# Patient Record
Sex: Female | Born: 1985 | Hispanic: No | Marital: Married | Smoking: Never smoker
Health system: Southern US, Community
[De-identification: ages and names within clinical notes are randomized; demographics above are authoritative.]

## PROBLEM LIST (undated history)

## (undated) ENCOUNTER — Inpatient Hospital Stay (HOSPITAL_COMMUNITY): Payer: Self-pay

## (undated) HISTORY — PX: WISDOM TOOTH EXTRACTION: SHX21

---

## 2011-12-06 NOTE — L&D Delivery Note (Signed)
Delivery Note At 5:08 PM a viable female was delivered via Vaginal, Spontaneous Delivery (Presentation: Left Occiput Anterior).  APGAR: 9, 9; weight pending.   Placenta status: Intact, Spontaneous.  Cord: 3 vessels with the following complications: None.  Cord pH: NA  Anesthesia: Local  Episiotomy: None Lacerations: 2nd degree Suture Repair: 3.0 vicryl rapide Est. Blood Loss (mL): 500  Mom to postpartum.  Baby to nursery-stable. Placenta to: BS Feeding: Breast Circ: no Contraception: undecided  Dorathy Kinsman 06/12/2012, 6:23 PM

## 2012-02-20 ENCOUNTER — Ambulatory Visit (INDEPENDENT_AMBULATORY_CARE_PROVIDER_SITE_OTHER): Payer: Medicaid Other | Admitting: Physician Assistant

## 2012-02-20 ENCOUNTER — Encounter: Payer: Self-pay | Admitting: Physician Assistant

## 2012-02-20 VITALS — BP 100/67 | Temp 98.6°F | Ht 68.0 in | Wt 172.0 lb

## 2012-02-20 DIAGNOSIS — O0930 Supervision of pregnancy with insufficient antenatal care, unspecified trimester: Secondary | ICD-10-CM

## 2012-02-20 DIAGNOSIS — Z3480 Encounter for supervision of other normal pregnancy, unspecified trimester: Secondary | ICD-10-CM

## 2012-02-20 NOTE — Progress Notes (Signed)
p=86 

## 2012-02-20 NOTE — Patient Instructions (Signed)
Pregnancy - Second Trimester The second trimester of pregnancy (3 to 6 months) is a period of rapid growth for you and your baby. At the end of the sixth month, your baby is about 9 inches long and weighs 1 1/2 pounds. You will begin to feel the baby move between 18 and 20 weeks of the pregnancy. This is called quickening. Weight gain is faster. A clear fluid (colostrum) may leak out of your breasts. You may feel small contractions of the womb (uterus). This is known as false labor or Braxton-Hicks contractions. This is like a practice for labor when the baby is ready to be born. Usually, the problems with morning sickness have usually passed by the end of your first trimester. Some women develop small dark blotches (called cholasma, mask of pregnancy) on their face that usually goes away after the baby is born. Exposure to the sun makes the blotches worse. Acne may also develop in some pregnant women and pregnant women who have acne, may find that it goes away. PRENATAL EXAMS  Blood work may continue to be done during prenatal exams. These tests are done to check on your health and the probable health of your baby. Blood work is used to follow your blood levels (hemoglobin). Anemia (low hemoglobin) is common during pregnancy. Iron and vitamins are given to help prevent this. You will also be checked for diabetes between 24 and 28 weeks of the pregnancy. Some of the previous blood tests may be repeated.   The size of the uterus is measured during each visit. This is to make sure that the baby is continuing to grow properly according to the dates of the pregnancy.   Your blood pressure is checked every prenatal visit. This is to make sure you are not getting toxemia.   Your urine is checked to make sure you do not have an infection, diabetes or protein in the urine.   Your weight is checked often to make sure gains are happening at the suggested rate. This is to ensure that both you and your baby are  growing normally.   Sometimes, an ultrasound is performed to confirm the proper growth and development of the baby. This is a test which bounces harmless sound waves off the baby so your caregiver can more accurately determine due dates.  Sometimes, a specialized test is done on the amniotic fluid surrounding the baby. This test is called an amniocentesis. The amniotic fluid is obtained by sticking a needle into the belly (abdomen). This is done to check the chromosomes in instances where there is a concern about possible genetic problems with the baby. It is also sometimes done near the end of pregnancy if an early delivery is required. In this case, it is done to help make sure the baby's lungs are mature enough for the baby to live outside of the womb. CHANGES OCCURING IN THE SECOND TRIMESTER OF PREGNANCY Your body goes through many changes during pregnancy. They vary from person to person. Talk to your caregiver about changes you notice that you are concerned about.  During the second trimester, you will likely have an increase in your appetite. It is normal to have cravings for certain foods. This varies from person to person and pregnancy to pregnancy.   Your lower abdomen will begin to bulge.   You may have to urinate more often because the uterus and baby are pressing on your bladder. It is also common to get more bladder infections during pregnancy (  pain with urination). You can help this by drinking lots of fluids and emptying your bladder before and after intercourse.   You may begin to get stretch marks on your hips, abdomen, and breasts. These are normal changes in the body during pregnancy. There are no exercises or medications to take that prevent this change.   You may begin to develop swollen and bulging veins (varicose veins) in your legs. Wearing support hose, elevating your feet for 15 minutes, 3 to 4 times a day and limiting salt in your diet helps lessen the problem.    Heartburn may develop as the uterus grows and pushes up against the stomach. Antacids recommended by your caregiver helps with this problem. Also, eating smaller meals 4 to 5 times a day helps.   Constipation can be treated with a stool softener or adding bulk to your diet. Drinking lots of fluids, vegetables, fruits, and whole grains are helpful.   Exercising is also helpful. If you have been very active up until your pregnancy, most of these activities can be continued during your pregnancy. If you have been less active, it is helpful to start an exercise program such as walking.   Hemorrhoids (varicose veins in the rectum) may develop at the end of the second trimester. Warm sitz baths and hemorrhoid cream recommended by your caregiver helps hemorrhoid problems.   Backaches may develop during this time of your pregnancy. Avoid heavy lifting, wear low heal shoes and practice good posture to help with backache problems.   Some pregnant women develop tingling and numbness of their hand and fingers because of swelling and tightening of ligaments in the wrist (carpel tunnel syndrome). This goes away after the baby is born.   As your breasts enlarge, you may have to get a bigger bra. Get a comfortable, cotton, support bra. Do not get a nursing bra until the last month of the pregnancy if you will be nursing the baby.   You may get a dark line from your belly button to the pubic area called the linea nigra.   You may develop rosy cheeks because of increase blood flow to the face.   You may develop spider looking lines of the face, neck, arms and chest. These go away after the baby is born.  HOME CARE INSTRUCTIONS   It is extremely important to avoid all smoking, herbs, alcohol, and unprescribed drugs during your pregnancy. These chemicals affect the formation and growth of the baby. Avoid these chemicals throughout the pregnancy to ensure the delivery of a healthy infant.   Most of your home  care instructions are the same as suggested for the first trimester of your pregnancy. Keep your caregiver's appointments. Follow your caregiver's instructions regarding medication use, exercise and diet.   During pregnancy, you are providing food for you and your baby. Continue to eat regular, well-balanced meals. Choose foods such as meat, fish, milk and other low fat dairy products, vegetables, fruits, and whole-grain breads and cereals. Your caregiver will tell you of the ideal weight gain.   A physical sexual relationship may be continued up until near the end of pregnancy if there are no other problems. Problems could include early (premature) leaking of amniotic fluid from the membranes, vaginal bleeding, abdominal pain, or other medical or pregnancy problems.   Exercise regularly if there are no restrictions. Check with your caregiver if you are unsure of the safety of some of your exercises. The greatest weight gain will occur in the   last 2 trimesters of pregnancy. Exercise will help you:   Control your weight.   Get you in shape for labor and delivery.   Lose weight after you have the baby.   Wear a good support or jogging bra for breast tenderness during pregnancy. This may help if worn during sleep. Pads or tissues may be used in the bra if you are leaking colostrum.   Do not use hot tubs, steam rooms or saunas throughout the pregnancy.   Wear your seat belt at all times when driving. This protects you and your baby if you are in an accident.   Avoid raw meat, uncooked cheese, cat litter boxes and soil used by cats. These carry germs that can cause birth defects in the baby.   The second trimester is also a good time to visit your dentist for your dental health if this has not been done yet. Getting your teeth cleaned is OK. Use a soft toothbrush. Brush gently during pregnancy.   It is easier to loose urine during pregnancy. Tightening up and strengthening the pelvic muscles will  help with this problem. Practice stopping your urination while you are going to the bathroom. These are the same muscles you need to strengthen. It is also the muscles you would use as if you were trying to stop from passing gas. You can practice tightening these muscles up 10 times a set and repeating this about 3 times per day. Once you know what muscles to tighten up, do not perform these exercises during urination. It is more likely to contribute to an infection by backing up the urine.   Ask for help if you have financial, counseling or nutritional needs during pregnancy. Your caregiver will be able to offer counseling for these needs as well as refer you for other special needs.   Your skin may become oily. If so, wash your face with mild soap, use non-greasy moisturizer and oil or cream based makeup.  MEDICATIONS AND DRUG USE IN PREGNANCY  Take prenatal vitamins as directed. The vitamin should contain 1 milligram of folic acid. Keep all vitamins out of reach of children. Only a couple vitamins or tablets containing iron may be fatal to a baby or young child when ingested.   Avoid use of all medications, including herbs, over-the-counter medications, not prescribed or suggested by your caregiver. Only take over-the-counter or prescription medicines for pain, discomfort, or fever as directed by your caregiver. Do not use aspirin.   Let your caregiver also know about herbs you may be using.   Alcohol is related to a number of birth defects. This includes fetal alcohol syndrome. All alcohol, in any form, should be avoided completely. Smoking will cause low birth rate and premature babies.   Street or illegal drugs are very harmful to the baby. They are absolutely forbidden. A baby born to an addicted mother will be addicted at birth. The baby will go through the same withdrawal an adult does.  SEEK MEDICAL CARE IF:  You have any concerns or worries during your pregnancy. It is better to call with  your questions if you feel they cannot wait, rather than worry about them. SEEK IMMEDIATE MEDICAL CARE IF:   An unexplained oral temperature above 102 F (38.9 C) develops, or as your caregiver suggests.   You have leaking of fluid from the vagina (birth canal). If leaking membranes are suspected, take your temperature and tell your caregiver of this when you call.   There   is vaginal spotting, bleeding, or passing clots. Tell your caregiver of the amount and how many pads are used. Light spotting in pregnancy is common, especially following intercourse.   You develop a bad smelling vaginal discharge with a change in the color from clear to white.   You continue to feel sick to your stomach (nauseated) and have no relief from remedies suggested. You vomit blood or coffee ground-like materials.   You lose more than 2 pounds of weight or gain more than 2 pounds of weight over 1 week, or as suggested by your caregiver.   You notice swelling of your face, hands, feet, or legs.   You get exposed to German measles and have never had them.   You are exposed to fifth disease or chickenpox.   You develop belly (abdominal) pain. Round ligament discomfort is a common non-cancerous (benign) cause of abdominal pain in pregnancy. Your caregiver still must evaluate you.   You develop a bad headache that does not go away.   You develop fever, diarrhea, pain with urination, or shortness of breath.   You develop visual problems, blurry, or double vision.   You fall or are in a car accident or any kind of trauma.   There is mental or physical violence at home.  Document Released: 11/15/2001 Document Revised: 11/10/2011 Document Reviewed: 05/20/2009 Exit[employer] Patient Information 2012 [employer]. 

## 2012-02-20 NOTE — Progress Notes (Signed)
New OB, late to care. Desires midwifery care.  Subjective:    Christy Koch is a G1P0 [redacted]w[redacted]d being seen today for her first obstetrical visit.  Her obstetrical history is significant for late to care. Patient does intend to breast feed. Pregnancy history fully reviewed.  Patient reports no bleeding, no contractions, no cramping, no leaking and some nausea.  Filed Vitals:   02/20/12 0850 02/20/12 0906  BP: 100/67   Temp: 98.6 F (37 C)   Height:  5\' 8"  (1.727 m)  Weight: 172 lb (78.019 kg)     HISTORY: OB History    Grav Para Term Preterm Abortions TAB SAB Ect Mult Living   1              # Outc Date GA Lbr Len/2nd Wgt Sex Del Anes PTL Lv   1 CUR              No past medical history on file. No past surgical history on file. No family history on file.   Exam    Uterine Size: 24 cm  Pelvic Exam: Deferred  System: Breast:  Inspection negative, left breast leaking   Skin: normal coloration and turgor, no rashes    Neurologic: oriented, normal, gait normal; reflexes normal and symmetric   Extremities: normal strength, tone, and muscle mass   HEENT grossly NL   Mouth/Teeth mucous membranes moist, pharynx normal without lesions   Neck supple and no masses   Cardiovascular: regular rate and rhythm, no murmurs or gallops   Respiratory:  appears well, vitals normal, no respiratory distress, acyanotic, normal RR   Abdomen: gravid, non tender size=dates   Urinary: not examined      Assessment:    Pregnancy: G1P0 Patient Active Problem List  Diagnoses  . Insufficient prenatal care        Plan:     Initial labs drawn. Prenatal vitamins. Problem list reviewed and updated. Anticipatory Guidance Genetic Screening discussed: Too late Ultrasound discussed; fetal survey: ordered. Follow up in 4 weeks.   Jakirah Zaun E. 02/20/2012

## 2012-02-21 LAB — OBSTETRIC PANEL
Basophils Absolute: 0 10*3/uL (ref 0.0–0.1)
Basophils Relative: 0 % (ref 0–1)
Eosinophils Absolute: 0.1 10*3/uL (ref 0.0–0.7)
Eosinophils Relative: 1 % (ref 0–5)
Eosinophils Relative: 1 % (ref 0–5)
Hemoglobin: 11.3 g/dL — ABNORMAL LOW (ref 12.0–15.0)
Hepatitis B Surface Ag: NEGATIVE
Lymphocytes Relative: 15 % (ref 12–46)
Lymphs Abs: 1.7 10*3/uL (ref 0.7–4.0)
Lymphs Abs: 1.8 10*3/uL (ref 0.7–4.0)
MCH: 28 pg (ref 26.0–34.0)
MCHC: 30.8 g/dL (ref 30.0–36.0)
MCV: 91.1 fL (ref 78.0–100.0)
MCV: 91.1 fL (ref 78.0–100.0)
Platelets: 228 10*3/uL (ref 150–400)
Platelets: 237 10*3/uL (ref 150–400)
RBC: 4.04 MIL/uL (ref 3.87–5.11)
RDW: 14.6 % (ref 11.5–15.5)
Rubella: 136.1 IU/mL — ABNORMAL HIGH
WBC: 11.6 10*3/uL — ABNORMAL HIGH (ref 4.0–10.5)

## 2012-02-21 LAB — HIV ANTIBODY (ROUTINE TESTING W REFLEX): HIV: NONREACTIVE

## 2012-02-21 LAB — GC/CHLAMYDIA PROBE AMP, URINE: GC Probe Amp, Urine: NEGATIVE

## 2012-02-22 LAB — CULTURE, URINE COMPREHENSIVE
Colony Count: NO GROWTH
Organism ID, Bacteria: NO GROWTH

## 2012-03-19 ENCOUNTER — Ambulatory Visit (INDEPENDENT_AMBULATORY_CARE_PROVIDER_SITE_OTHER): Payer: Medicaid Other | Admitting: Advanced Practice Midwife

## 2012-03-19 ENCOUNTER — Ambulatory Visit (HOSPITAL_COMMUNITY)
Admission: RE | Admit: 2012-03-19 | Discharge: 2012-03-19 | Disposition: A | Discharge status: Home / Self Care | Payer: Medicaid Other | Source: Ambulatory Visit | Attending: Physician Assistant | Admitting: Physician Assistant

## 2012-03-19 ENCOUNTER — Encounter: Payer: Self-pay | Admitting: Advanced Practice Midwife

## 2012-03-19 ENCOUNTER — Other Ambulatory Visit: Payer: Self-pay | Admitting: Physician Assistant

## 2012-03-19 VITALS — BP 98/66 | Temp 98.0°F | Wt 172.0 lb

## 2012-03-19 DIAGNOSIS — O358XX0 Maternal care for other (suspected) fetal abnormality and damage, not applicable or unspecified: Secondary | ICD-10-CM | POA: Insufficient documentation

## 2012-03-19 DIAGNOSIS — Z1389 Encounter for screening for other disorder: Secondary | ICD-10-CM | POA: Insufficient documentation

## 2012-03-19 DIAGNOSIS — O0930 Supervision of pregnancy with insufficient antenatal care, unspecified trimester: Secondary | ICD-10-CM

## 2012-03-19 DIAGNOSIS — Z363 Encounter for antenatal screening for malformations: Secondary | ICD-10-CM | POA: Insufficient documentation

## 2012-03-19 NOTE — Progress Notes (Signed)
Discussed midwifery care and some discussion of waterbirth.  Lives in [place] and commutes. Does not want to do glucola today. Wants to do jellybean version.

## 2012-03-19 NOTE — Patient Instructions (Signed)
Pregnancy - Second Trimester The second trimester of pregnancy (3 to 6 months) is a period of rapid growth for you and your baby. At the end of the sixth month, your baby is about 9 inches long and weighs 1 1/2 pounds. You will begin to feel the baby move between 18 and 20 weeks of the pregnancy. This is called quickening. Weight gain is faster. A clear fluid (colostrum) may leak out of your breasts. You may feel small contractions of the womb (uterus). This is known as false labor or Braxton-Hicks contractions. This is like a practice for labor when the baby is ready to be born. Usually, the problems with morning sickness have usually passed by the end of your first trimester. Some women develop small dark blotches (called cholasma, mask of pregnancy) on their face that usually goes away after the baby is born. Exposure to the sun makes the blotches worse. Acne may also develop in some pregnant women and pregnant women who have acne, may find that it goes away. PRENATAL EXAMS  Blood work may continue to be done during prenatal exams. These tests are done to check on your health and the probable health of your baby. Blood work is used to follow your blood levels (hemoglobin). Anemia (low hemoglobin) is common during pregnancy. Iron and vitamins are given to help prevent this. You will also be checked for diabetes between 24 and 28 weeks of the pregnancy. Some of the previous blood tests may be repeated.   The size of the uterus is measured during each visit. This is to make sure that the baby is continuing to grow properly according to the dates of the pregnancy.   Your blood pressure is checked every prenatal visit. This is to make sure you are not getting toxemia.   Your urine is checked to make sure you do not have an infection, diabetes or protein in the urine.   Your weight is checked often to make sure gains are happening at the suggested rate. This is to ensure that both you and your baby are  growing normally.   Sometimes, an ultrasound is performed to confirm the proper growth and development of the baby. This is a test which bounces harmless sound waves off the baby so your caregiver can more accurately determine due dates.  Sometimes, a specialized test is done on the amniotic fluid surrounding the baby. This test is called an amniocentesis. The amniotic fluid is obtained by sticking a needle into the belly (abdomen). This is done to check the chromosomes in instances where there is a concern about possible genetic problems with the baby. It is also sometimes done near the end of pregnancy if an early delivery is required. In this case, it is done to help make sure the baby's lungs are mature enough for the baby to live outside of the womb. CHANGES OCCURING IN THE SECOND TRIMESTER OF PREGNANCY Your body goes through many changes during pregnancy. They vary from person to person. Talk to your caregiver about changes you notice that you are concerned about.  During the second trimester, you will likely have an increase in your appetite. It is normal to have cravings for certain foods. This varies from person to person and pregnancy to pregnancy.   Your lower abdomen will begin to bulge.   You may have to urinate more often because the uterus and baby are pressing on your bladder. It is also common to get more bladder infections during pregnancy (  pain with urination). You can help this by drinking lots of fluids and emptying your bladder before and after intercourse.   You may begin to get stretch marks on your hips, abdomen, and breasts. These are normal changes in the body during pregnancy. There are no exercises or medications to take that prevent this change.   You may begin to develop swollen and bulging veins (varicose veins) in your legs. Wearing support hose, elevating your feet for 15 minutes, 3 to 4 times a day and limiting salt in your diet helps lessen the problem.    Heartburn may develop as the uterus grows and pushes up against the stomach. Antacids recommended by your caregiver helps with this problem. Also, eating smaller meals 4 to 5 times a day helps.   Constipation can be treated with a stool softener or adding bulk to your diet. Drinking lots of fluids, vegetables, fruits, and whole grains are helpful.   Exercising is also helpful. If you have been very active up until your pregnancy, most of these activities can be continued during your pregnancy. If you have been less active, it is helpful to start an exercise program such as walking.   Hemorrhoids (varicose veins in the rectum) may develop at the end of the second trimester. Warm sitz baths and hemorrhoid cream recommended by your caregiver helps hemorrhoid problems.   Backaches may develop during this time of your pregnancy. Avoid heavy lifting, wear low heal shoes and practice good posture to help with backache problems.   Some pregnant women develop tingling and numbness of their hand and fingers because of swelling and tightening of ligaments in the wrist (carpel tunnel syndrome). This goes away after the baby is born.   As your breasts enlarge, you may have to get a bigger bra. Get a comfortable, cotton, support bra. Do not get a nursing bra until the last month of the pregnancy if you will be nursing the baby.   You may get a dark line from your belly button to the pubic area called the linea nigra.   You may develop rosy cheeks because of increase blood flow to the face.   You may develop spider looking lines of the face, neck, arms and chest. These go away after the baby is born.  HOME CARE INSTRUCTIONS   It is extremely important to avoid all smoking, herbs, alcohol, and unprescribed drugs during your pregnancy. These chemicals affect the formation and growth of the baby. Avoid these chemicals throughout the pregnancy to ensure the delivery of a healthy infant.   Most of your home  care instructions are the same as suggested for the first trimester of your pregnancy. Keep your caregiver's appointments. Follow your caregiver's instructions regarding medication use, exercise and diet.   During pregnancy, you are providing food for you and your baby. Continue to eat regular, well-balanced meals. Choose foods such as meat, fish, milk and other low fat dairy products, vegetables, fruits, and whole-grain breads and cereals. Your caregiver will tell you of the ideal weight gain.   A physical sexual relationship may be continued up until near the end of pregnancy if there are no other problems. Problems could include early (premature) leaking of amniotic fluid from the membranes, vaginal bleeding, abdominal pain, or other medical or pregnancy problems.   Exercise regularly if there are no restrictions. Check with your caregiver if you are unsure of the safety of some of your exercises. The greatest weight gain will occur in the   last 2 trimesters of pregnancy. Exercise will help you:   Control your weight.   Get you in shape for labor and delivery.   Lose weight after you have the baby.   Wear a good support or jogging bra for breast tenderness during pregnancy. This may help if worn during sleep. Pads or tissues may be used in the bra if you are leaking colostrum.   Do not use hot tubs, steam rooms or saunas throughout the pregnancy.   Wear your seat belt at all times when driving. This protects you and your baby if you are in an accident.   Avoid raw meat, uncooked cheese, cat litter boxes and soil used by cats. These carry germs that can cause birth defects in the baby.   The second trimester is also a good time to visit your dentist for your dental health if this has not been done yet. Getting your teeth cleaned is OK. Use a soft toothbrush. Brush gently during pregnancy.   It is easier to loose urine during pregnancy. Tightening up and strengthening the pelvic muscles will  help with this problem. Practice stopping your urination while you are going to the bathroom. These are the same muscles you need to strengthen. It is also the muscles you would use as if you were trying to stop from passing gas. You can practice tightening these muscles up 10 times a set and repeating this about 3 times per day. Once you know what muscles to tighten up, do not perform these exercises during urination. It is more likely to contribute to an infection by backing up the urine.   Ask for help if you have financial, counseling or nutritional needs during pregnancy. Your caregiver will be able to offer counseling for these needs as well as refer you for other special needs.   Your skin may become oily. If so, wash your face with mild soap, use non-greasy moisturizer and oil or cream based makeup.  MEDICATIONS AND DRUG USE IN PREGNANCY  Take prenatal vitamins as directed. The vitamin should contain 1 milligram of folic acid. Keep all vitamins out of reach of children. Only a couple vitamins or tablets containing iron may be fatal to a baby or young child when ingested.   Avoid use of all medications, including herbs, over-the-counter medications, not prescribed or suggested by your caregiver. Only take over-the-counter or prescription medicines for pain, discomfort, or fever as directed by your caregiver. Do not use aspirin.   Let your caregiver also know about herbs you may be using.   Alcohol is related to a number of birth defects. This includes fetal alcohol syndrome. All alcohol, in any form, should be avoided completely. Smoking will cause low birth rate and premature babies.   Street or illegal drugs are very harmful to the baby. They are absolutely forbidden. A baby born to an addicted mother will be addicted at birth. The baby will go through the same withdrawal an adult does.  SEEK MEDICAL CARE IF:  You have any concerns or worries during your pregnancy. It is better to call with  your questions if you feel they cannot wait, rather than worry about them. SEEK IMMEDIATE MEDICAL CARE IF:   An unexplained oral temperature above 102 F (38.9 C) develops, or as your caregiver suggests.   You have leaking of fluid from the vagina (birth canal). If leaking membranes are suspected, take your temperature and tell your caregiver of this when you call.   There   is vaginal spotting, bleeding, or passing clots. Tell your caregiver of the amount and how many pads are used. Light spotting in pregnancy is common, especially following intercourse.   You develop a bad smelling vaginal discharge with a change in the color from clear to white.   You continue to feel sick to your stomach (nauseated) and have no relief from remedies suggested. You vomit blood or coffee ground-like materials.   You lose more than 2 pounds of weight or gain more than 2 pounds of weight over 1 week, or as suggested by your caregiver.   You notice swelling of your face, hands, feet, or legs.   You get exposed to German measles and have never had them.   You are exposed to fifth disease or chickenpox.   You develop belly (abdominal) pain. Round ligament discomfort is a common non-cancerous (benign) cause of abdominal pain in pregnancy. Your caregiver still must evaluate you.   You develop a bad headache that does not go away.   You develop fever, diarrhea, pain with urination, or shortness of breath.   You develop visual problems, blurry, or double vision.   You fall or are in a car accident or any kind of trauma.   There is mental or physical violence at home.  Document Released: 11/15/2001 Document Revised: 11/10/2011 Document Reviewed: 05/20/2009 Exit[employer] Patient Information 2012 [employer]. 

## 2012-03-19 NOTE — Progress Notes (Signed)
p-90  Pt needs written orders for 28 wk labs to be drawn closer to home

## 2012-04-27 ENCOUNTER — Encounter: Payer: Self-pay | Admitting: Advanced Practice Midwife

## 2012-04-27 ENCOUNTER — Ambulatory Visit (INDEPENDENT_AMBULATORY_CARE_PROVIDER_SITE_OTHER): Payer: Medicaid Other | Admitting: Advanced Practice Midwife

## 2012-04-27 VITALS — BP 106/73 | Temp 98.5°F | Wt 168.0 lb

## 2012-04-27 DIAGNOSIS — Z331 Pregnant state, incidental: Secondary | ICD-10-CM

## 2012-04-27 DIAGNOSIS — O26842 Uterine size-date discrepancy, second trimester: Secondary | ICD-10-CM

## 2012-04-27 DIAGNOSIS — O26849 Uterine size-date discrepancy, unspecified trimester: Secondary | ICD-10-CM

## 2012-04-27 DIAGNOSIS — Z3490 Encounter for supervision of normal pregnancy, unspecified, unspecified trimester: Secondary | ICD-10-CM | POA: Insufficient documentation

## 2012-04-27 NOTE — Progress Notes (Signed)
Did not do GTT due to illness. Strongly encouraged to do so ASAP. Will do in [place]. Many questions RE: hospital routines, policies, waterbirth. Desire low-intervention. Considering water labor or birth. Rec class. Discussed CBE options. May declined e-mycin for baby. Rec GC/CT testing at 36 weeks for documentation. S>D on anatomy US 03/19/12. 12 days ahead. Had [place] at ~14 weeks. Unsure of assigned EDD. Will request records to determine best EDD. Will use 06/05/12 EDD otherwise. Consider F/U growth [place] at 36 weeks.

## 2012-04-27 NOTE — Progress Notes (Signed)
p-85  Does not want to know sex of baby

## 2012-05-11 ENCOUNTER — Encounter: Payer: Self-pay | Admitting: Family

## 2012-05-11 ENCOUNTER — Ambulatory Visit (INDEPENDENT_AMBULATORY_CARE_PROVIDER_SITE_OTHER): Payer: Medicaid Other | Admitting: Family

## 2012-05-11 VITALS — BP 113/73 | Temp 98.5°F | Wt 171.0 lb

## 2012-05-11 DIAGNOSIS — Z3490 Encounter for supervision of normal pregnancy, unspecified, unspecified trimester: Secondary | ICD-10-CM

## 2012-05-11 DIAGNOSIS — Z3480 Encounter for supervision of other normal pregnancy, unspecified trimester: Secondary | ICD-10-CM

## 2012-05-11 NOTE — Progress Notes (Signed)
p-85  Pt has never done her 1 hr GTT, wanted to do the jellybean version but still hasn't done it.

## 2012-05-11 NOTE — Progress Notes (Signed)
Reports feeling pressure on cervix; no contractions, vaginal bleeding or leaking of fluid; has not completed 1 hr yet, obtain random CBG today; if not completed by next visit will do a fasting CBG; preterm labor precautions.  EDD change received copy of ultrasound completed on 12/16/11 showing a EDD of 06/10/13, measuring 14.5; pt states certain LMP (-7day diff>defer to LMP date).

## 2012-05-12 LAB — GLUCOSE, RANDOM: Glucose, Bld: 75 mg/dL (ref 70–99)

## 2012-06-01 ENCOUNTER — Ambulatory Visit (INDEPENDENT_AMBULATORY_CARE_PROVIDER_SITE_OTHER): Payer: Medicaid Other | Admitting: Advanced Practice Midwife

## 2012-06-01 ENCOUNTER — Other Ambulatory Visit: Payer: Self-pay | Admitting: Advanced Practice Midwife

## 2012-06-01 VITALS — BP 111/76 | Temp 98.4°F | Wt 170.0 lb

## 2012-06-01 DIAGNOSIS — Z3400 Encounter for supervision of normal first pregnancy, unspecified trimester: Secondary | ICD-10-CM

## 2012-06-01 LAB — POCT CBG (FASTING - GLUCOSE)-MANUAL ENTRY: Glucose Fasting, POC: 86 mg/dL (ref 70–99)

## 2012-06-01 NOTE — Progress Notes (Signed)
p-79  CBG today fasting is 86

## 2012-06-01 NOTE — Patient Instructions (Signed)
Normal Labor and Delivery Your caregiver must first be sure you are in labor. Signs of labor include:  You may pass what is called "the mucus plug" before labor begins. This is a small amount of blood stained mucus.   Regular uterine contractions.   The time between contractions get closer together.   The discomfort and pain gradually gets more intense.   Pains are mostly located in the back.   Pains get worse when walking.   The cervix (the opening of the uterus becomes thinner (begins to efface) and opens up (dilates).  Once you are in labor and admitted into the hospital or care center, your caregiver will do the following:  A complete physical examination.   Check your vital signs (blood pressure, pulse, temperature and the fetal heart rate).   Do a vaginal examination (using a sterile glove and lubricant) to determine:   The position (presentation) of the baby (head [vertex] or buttock first).   The level (station) of the baby's head in the birth canal.   The effacement and dilatation of the cervix.   You may have your pubic hair shaved and be given an enema depending on your caregiver and the circumstance.   An electronic monitor is usually placed on your abdomen. The monitor follows the length and intensity of the contractions, as well as the baby's heart rate.   Usually, your caregiver will insert an IV in your arm with a bottle of sugar water. This is done as a precaution so that medications can be given to you quickly during labor or delivery.  NORMAL LABOR AND DELIVERY IS DIVIDED UP INTO 3 STAGES: First Stage This is when regular contractions begin and the cervix begins to efface and dilate. This stage can last from 3 to 15 hours. The end of the first stage is when the cervix is 100% effaced and 10 centimeters dilated. Pain medications may be given by   Injection (morphine, demerol, etc.)   Regional anesthesia (spinal, caudal or epidural, anesthetics given in  different locations of the spine). Paracervical pain medication may be given, which is an injection of and anesthetic on each side of the cervix.  A pregnant woman may request to have "Natural Childbirth" which is not to have any medications or anesthesia during her labor and delivery. Second Stage This is when the baby comes down through the birth canal (vagina) and is born. This can take 1 to 4 hours. As the baby's head comes down through the birth canal, you may feel like you are going to have a bowel movement. You will get the urge to bear down and push until the baby is delivered. As the baby's head is being delivered, the caregiver will decide if an episiotomy (a cut in the perineum and vagina area) is needed to prevent tearing of the tissue in this area. The episiotomy is sewn up after the delivery of the baby and placenta. Sometimes a mask with nitrous oxide is given for the mother to breath during the delivery of the baby to help if there is too much pain. The end of Stage 2 is when the baby is fully delivered. Then when the umbilical cord stops pulsating it is clamped and cut. Third Stage The third stage begins after the baby is completely delivered and ends after the placenta (afterbirth) is delivered. This usually takes 5 to 30 minutes. After the placenta is delivered, a medication is given either by intravenous or injection to help contract   the uterus and prevent bleeding. The third stage is not painful and pain medication is usually not necessary. If an episiotomy was done, it is repaired at this time. After the delivery, the mother is watched and monitored closely for 1 to 2 hours to make sure there is no postpartum bleeding (hemorrhage). If there is a lot of bleeding, medication is given to contract the uterus and stop the bleeding. Document Released: 08/30/2008 Document Revised: 11/10/2011 Document Reviewed: 08/30/2008 [employer] Patient Information 2012 [employer]. 

## 2012-06-02 LAB — GC/CHLAMYDIA PROBE AMP, GENITAL
Chlamydia, DNA Probe: NEGATIVE
GC Probe Amp, Genital: NEGATIVE

## 2012-06-05 ENCOUNTER — Encounter: Payer: Self-pay | Admitting: Advanced Practice Midwife

## 2012-06-05 LAB — CULTURE, BETA STREP (GROUP B ONLY)

## 2012-06-05 NOTE — Progress Notes (Signed)
Reviewed normal FBS. Still concerned about correct EDD. Staying in friend's house in [place] for 3 weeks. Reviewed s/s labor.

## 2012-06-11 ENCOUNTER — Ambulatory Visit (INDEPENDENT_AMBULATORY_CARE_PROVIDER_SITE_OTHER): Payer: Medicaid Other | Admitting: Family

## 2012-06-11 VITALS — BP 101/73 | Wt 170.0 lb

## 2012-06-11 DIAGNOSIS — Z3490 Encounter for supervision of normal pregnancy, unspecified, unspecified trimester: Secondary | ICD-10-CM

## 2012-06-11 DIAGNOSIS — Z3480 Encounter for supervision of other normal pregnancy, unspecified trimester: Secondary | ICD-10-CM

## 2012-06-11 NOTE — Progress Notes (Signed)
Random BS 86; irregular contractions; reviewed birth plan and received copy; reviewed labor precautions. Desires info on cord blood banking.

## 2012-06-11 NOTE — Progress Notes (Signed)
Routine prenatal check, doing well, increased intensity in contractions and pelvic pressure.

## 2012-06-12 ENCOUNTER — Encounter (HOSPITAL_COMMUNITY): Payer: Self-pay | Admitting: Anesthesiology

## 2012-06-12 ENCOUNTER — Encounter (HOSPITAL_COMMUNITY): Payer: Self-pay | Admitting: *Deleted

## 2012-06-12 ENCOUNTER — Inpatient Hospital Stay (HOSPITAL_COMMUNITY)
Admission: AD | Admit: 2012-06-12 | Discharge: 2012-06-13 | DRG: 775 | Disposition: A | Discharge status: Home / Self Care | Payer: Medicaid Other | Source: Ambulatory Visit | Attending: Obstetrics & Gynecology | Admitting: Obstetrics & Gynecology

## 2012-06-12 DIAGNOSIS — O26849 Uterine size-date discrepancy, unspecified trimester: Secondary | ICD-10-CM

## 2012-06-12 DIAGNOSIS — Z3490 Encounter for supervision of normal pregnancy, unspecified, unspecified trimester: Secondary | ICD-10-CM

## 2012-06-12 DIAGNOSIS — O0930 Supervision of pregnancy with insufficient antenatal care, unspecified trimester: Secondary | ICD-10-CM

## 2012-06-12 LAB — CBC
HCT: 34.8 % — ABNORMAL LOW (ref 36.0–46.0)
Hemoglobin: 11.2 g/dL — ABNORMAL LOW (ref 12.0–15.0)
MCHC: 32.2 g/dL (ref 30.0–36.0)
RDW: 14.9 % (ref 11.5–15.5)
WBC: 12.8 10*3/uL — ABNORMAL HIGH (ref 4.0–10.5)

## 2012-06-12 LAB — RPR: RPR Ser Ql: NONREACTIVE

## 2012-06-12 MED ORDER — LACTATED RINGERS IV SOLN: 500.0000 mL | INTRAVENOUS | Status: DC | PRN

## 2012-06-12 MED ORDER — LACTATED RINGERS IV SOLN: INTRAVENOUS | Status: DC

## 2012-06-12 MED ORDER — MEASLES, MUMPS & RUBELLA VAC ~~LOC~~ INJ: 0.5000 mL | INJECTION | Freq: Once | SUBCUTANEOUS | Status: DC

## 2012-06-12 MED ORDER — FLEET ENEMA 7-19 GM/118ML RE ENEM: 1.0000 | ENEMA | RECTAL | Status: DC | PRN

## 2012-06-12 MED ORDER — ONDANSETRON HCL 4 MG/2ML IJ SOLN: 4.0000 mg | Freq: Four times a day (QID) | INTRAMUSCULAR | Status: DC | PRN

## 2012-06-12 MED ORDER — METHYLERGONOVINE MALEATE 0.2 MG PO TABS: 0.2000 mg | ORAL_TABLET | ORAL | Status: DC | PRN

## 2012-06-12 MED ORDER — ACETAMINOPHEN 325 MG PO TABS: 650.0000 mg | ORAL_TABLET | ORAL | Status: DC | PRN

## 2012-06-12 MED ORDER — CITRIC ACID-SODIUM CITRATE 334-500 MG/5ML PO SOLN: 30.0000 mL | ORAL | Status: DC | PRN

## 2012-06-12 MED ORDER — ONDANSETRON HCL 4 MG/2ML IJ SOLN: 4.0000 mg | INTRAMUSCULAR | Status: DC | PRN

## 2012-06-12 MED ORDER — OXYTOCIN BOLUS FROM INFUSION
250.0000 mL | Freq: Once | INTRAVENOUS | Status: DC
  Filled 2012-06-12: qty 500

## 2012-06-12 MED ORDER — IBUPROFEN 600 MG PO TABS: 600.0000 mg | ORAL_TABLET | Freq: Four times a day (QID) | ORAL | Status: DC | PRN

## 2012-06-12 MED ORDER — IBUPROFEN 600 MG PO TABS: 600.0000 mg | ORAL_TABLET | Freq: Four times a day (QID) | ORAL | Status: DC

## 2012-06-12 MED ORDER — METHYLERGONOVINE MALEATE 0.2 MG/ML IJ SOLN: 0.2000 mg | INTRAMUSCULAR | Status: DC | PRN

## 2012-06-12 MED ORDER — SIMETHICONE 80 MG PO CHEW: 80.0000 mg | CHEWABLE_TABLET | ORAL | Status: DC | PRN

## 2012-06-12 MED ORDER — WITCH HAZEL-GLYCERIN EX PADS: 1.0000 "application " | MEDICATED_PAD | CUTANEOUS | Status: DC | PRN

## 2012-06-12 MED ORDER — ZOLPIDEM TARTRATE 5 MG PO TABS: 5.0000 mg | ORAL_TABLET | Freq: Every evening | ORAL | Status: DC | PRN

## 2012-06-12 MED ORDER — LIDOCAINE HCL (PF) 1 % IJ SOLN
30.0000 mL | INTRAMUSCULAR | Status: DC | PRN
  Administered 2012-06-12: 30 mL via SUBCUTANEOUS
  Filled 2012-06-12: qty 30

## 2012-06-12 MED ORDER — MAGNESIUM HYDROXIDE 400 MG/5ML PO SUSP: 30.0000 mL | ORAL | Status: DC | PRN

## 2012-06-12 MED ORDER — CALCIUM CARBONATE ANTACID 500 MG PO CHEW: 1.0000 | CHEWABLE_TABLET | Freq: Three times a day (TID) | ORAL | Status: DC

## 2012-06-12 MED ORDER — OXYTOCIN 10 UNIT/ML IJ SOLN
INTRAMUSCULAR | Status: AC
  Filled 2012-06-12: qty 2

## 2012-06-12 MED ORDER — OXYTOCIN 40 UNITS IN LACTATED RINGERS INFUSION - SIMPLE MED: 62.5000 mL/h | Freq: Once | INTRAVENOUS | Status: DC

## 2012-06-12 MED ORDER — OXYCODONE-ACETAMINOPHEN 5-325 MG PO TABS: 1.0000 | ORAL_TABLET | ORAL | Status: DC | PRN

## 2012-06-12 MED ORDER — DIBUCAINE 1 % RE OINT: 1.0000 "application " | TOPICAL_OINTMENT | RECTAL | Status: DC | PRN

## 2012-06-12 MED ORDER — TETANUS-DIPHTH-ACELL PERTUSSIS 5-2.5-18.5 LF-MCG/0.5 IM SUSP: 0.5000 mL | Freq: Once | INTRAMUSCULAR | Status: DC

## 2012-06-12 MED ORDER — FERROUS SULFATE 325 (65 FE) MG PO TABS
325.0000 mg | ORAL_TABLET | Freq: Two times a day (BID) | ORAL | Status: DC
  Filled 2012-06-12: qty 1

## 2012-06-12 MED ORDER — SENNOSIDES-DOCUSATE SODIUM 8.6-50 MG PO TABS: 2.0000 | ORAL_TABLET | Freq: Every day | ORAL | Status: DC

## 2012-06-12 MED ORDER — DIPHENHYDRAMINE HCL 25 MG PO CAPS: 25.0000 mg | ORAL_CAPSULE | Freq: Four times a day (QID) | ORAL | Status: DC | PRN

## 2012-06-12 MED ORDER — LANOLIN HYDROUS EX OINT: 1.0000 "application " | TOPICAL_OINTMENT | CUTANEOUS | Status: DC | PRN

## 2012-06-12 MED ORDER — ONDANSETRON HCL 4 MG PO TABS: 4.0000 mg | ORAL_TABLET | ORAL | Status: DC | PRN

## 2012-06-12 MED ORDER — BENZOCAINE-MENTHOL 20-0.5 % EX AERO
1.0000 "application " | INHALATION_SPRAY | CUTANEOUS | Status: DC | PRN
  Administered 2012-06-13: 1 via TOPICAL
  Filled 2012-06-12 (×2): qty 56

## 2012-06-12 MED ORDER — PRENATAL MULTIVITAMIN CH
1.0000 | ORAL_TABLET | Freq: Every day | ORAL | Status: DC
  Filled 2012-06-12: qty 1

## 2012-06-12 NOTE — Progress Notes (Signed)
Christy Koch is a 26 y.o. G1P0000 at [redacted]w[redacted]d.  Subjective: Mild involuntary urge to push. Coping well.  Objective: BP 108/66  Pulse 98  Temp 98.3 F (36.8 C) (Oral)  Resp 20  Ht 5\' 6"  (1.676 m)  Wt 79.379 kg (175 lb)  BMI 28.25 kg/m2  LMP 09/11/2011      FHT:  FHR: 130 bpm, variability: moderate,  accelerations:  Present,  decelerations:  Absent UC:   regular, every 3 minutes SVE:   Dilation: 8 Effacement (%): 100 Station: 0 Exam by:: [place] CNM [facility] [employer]: Lab Results  Component Value Date   WBC 12.8* 06/12/2012   HGB 11.2* 06/12/2012   HCT 34.8* 06/12/2012   MCV 79.6 06/12/2012   PLT 220 06/12/2012    Assessment / Plan: Spontaneous labor, progressing normally  Labor: Progressing normally Preeclampsia:  NA Fetal Wellbeing:  Category I Pain Control:  Labor support without medications I/D:  n/a Anticipated MOD:  NSVD  Christy Koch 06/12/2012, 1:06 PM

## 2012-06-12 NOTE — Progress Notes (Signed)
Christy Koch is a 26 y.o. G1P0000 at [redacted]w[redacted]d by LMP admitted for Labor  Subjective: Reports increasing pressure. Comfort with squatting  Objective: BP 108/66  Pulse 98  Temp 97.4 F (36.3 C) (Oral)  Resp 20  Ht 5\' 6"  (1.676 m)  Wt 175 lb (79.379 kg)  BMI 28.25 kg/m2  LMP 09/11/2011      FHT:  135 doppler UC:   irregular, every 2-5 minutes SVE:  Deferred exam at this time  Labs: Lab Results  Component Value Date   WBC 12.8* 06/12/2012   HGB 11.2* 06/12/2012   HCT 34.8* 06/12/2012   MCV 79.6 06/12/2012   PLT 220 06/12/2012    Assessment / Plan: Spontaneous labor, progressing normally  Labor: Progressing normally Preeclampsia:  n/a Fetal Wellbeing:  Category I Pain Control:  Labor support without medications I/D:  n/a Anticipated MOD:  NSVD  Christy Koch E. 06/12/2012, 10:52 AM

## 2012-06-12 NOTE — H&P (Signed)
Attestation of Attending Supervision of Advanced Practitioner (CNM/NP): Evaluation and management procedures were performed by the Advanced Practitioner under my supervision and collaboration.  I have reviewed the Advanced Practitioner's note and chart, and I agree with the management and plan.  Jaynie Collins, M.D. 06/12/2012 4:42 AM

## 2012-06-12 NOTE — MAU Note (Signed)
PT SAYS UC HURT AT 5 PM.    IN OFFICE -  TODAY 1-2 CM.   DENIES HSV AND MRSA

## 2012-06-12 NOTE — Progress Notes (Signed)
Christy Koch is a 26 y.o. G1P0 at [redacted]w[redacted]d by LMP admitted for early labor  Subjective: Reports UCs q 5 mins, increased show after void  Objective: BP 110/71  Pulse 83  Temp 98 F (36.7 C) (Oral)  Resp 18  Ht 5\' 6"  (1.676 m)  Wt 175 lb (79.379 kg)  BMI 28.25 kg/m2  LMP 09/11/2011  Patient in tub, small amount of bright red show noted on pad in floor    FHT: 125 doppler UC:   regular, every 5 minutes SVE:  deferred  Labs: Lab Results  Component Value Date   WBC 12.8* 06/12/2012   HGB 11.2* 06/12/2012   HCT 34.8* 06/12/2012   MCV 79.6 06/12/2012   PLT 220 06/12/2012    Assessment / Plan: Early Labor  Labor: Progressing normally Preeclampsia:  n/a Fetal Wellbeing:  Category I Pain Control:  Labor support without medications, in tub I/D:  n/a Anticipated MOD:  NSVD  Christy Koch E. 06/12/2012, 5:16 AM

## 2012-06-12 NOTE — Progress Notes (Signed)
Christy Koch is a 26 y.o. G1P0 at [redacted]w[redacted]d by LMP admitted for suspected labor  Subjective: Resting comfortably between contractions.  Objective: BP 110/71  Pulse 83  Temp 98 F (36.7 C) (Oral)  Resp 18  Ht 5\' 6"  (1.676 m)  Wt 175 lb (79.379 kg)  BMI 28.25 kg/m2  LMP 09/11/2011      FHT:  Doppler 145 UC:   irregular SVE:   Dilation: 4.5 Effacement (%): 70 Station: -2 Exam by:: L. Munford RN  Labs: Lab Results  Component Value Date   WBC 12.8* 06/12/2012   HGB 11.2* 06/12/2012   HCT 34.8* 06/12/2012   MCV 79.6 06/12/2012   PLT 220 06/12/2012    Assessment / Plan: Early labor  Labor: Progressing normally Preeclampsia:  n/a Fetal Wellbeing:  Category I Pain Control:  Labor support without medications I/D:  n/a Anticipated MOD:  NSVD  Christy Koch E. 06/12/2012, 2:44 AM

## 2012-06-12 NOTE — H&P (Signed)
Christy Koch is a 26 y.o. female presenting for Labor Evaluation. Maternal Medical History:  Reason for admission: Reason for admission: contractions.  Contractions: Onset was 6-12 hours ago.   Frequency: regular.   Perceived severity is strong.    Fetal activity: Perceived fetal activity is normal.   Last perceived fetal movement was within the past hour.    Prenatal complications: Late PNC    OB History    Grav Para Term Preterm Abortions TAB SAB Ect Mult Living   1              History reviewed. No pertinent past medical history. History reviewed. No pertinent past surgical history. Family History: family history is not on file. Social History:  reports that she has never smoked. She does not have any smokeless tobacco history on file. She reports that she does not drink alcohol or use illicit drugs.   Prenatal Transfer Tool  Maternal Diabetes: No Genetic Screening: Declined, CF Neg Maternal Ultrasounds/Referrals: Normal Fetal Ultrasounds or other Referrals:  None Maternal Substance Abuse:  No Significant Maternal Medications:  None Significant Maternal Lab Results:  None Other Comments:  None  Review of Systems  Constitutional: Negative.   HENT: Negative.   Eyes: Negative.   Respiratory: Negative.   Cardiovascular: Negative.   Gastrointestinal: Positive for abdominal pain.    Dilation: 4.5 Effacement (%): 70 Station: -2 Exam by:: L. Munford RN Blood pressure 110/71, pulse 83, temperature 98 F (36.7 C), temperature source Oral, resp. rate 18, height 5\' 6"  (1.676 m), weight 175 lb (79.379 kg), last menstrual period 09/11/2011. Maternal Exam:  Uterine Assessment: Contraction strength is firm.  Contraction frequency is irregular.   Abdomen: Patient reports no abdominal tenderness. Fetal presentation: vertex  Introitus: Normal vulva. Normal vagina.    Fetal Exam Fetal Monitor Review: Mode: ultrasound.   Variability: moderate (6-25 bpm).   Pattern:  accelerations present and no decelerations.    Fetal State Assessment: Category I - tracings are normal.     Physical Exam  Constitutional: She is oriented to person, place, and time. She appears well-developed.  HENT:  Head: Normocephalic and atraumatic.  Cardiovascular: Normal rate and regular rhythm.   GI:       Gravid, non tender   Genitourinary:       4-5/80/vtx/-2  Musculoskeletal: Normal range of motion.  Neurological: She is alert and oriented to person, place, and time. She has normal reflexes.  Skin: Skin is warm and dry.  Psychiatric: She has a normal mood and affect. Her behavior is normal.    Prenatal labs: ABO, Rh: B, B/POS, POS/-- (03/18 0930) Antibody: NEG, NEG (03/18 0930) Rubella: 134.3, 136.1 (03/18 0930) RPR: NON REAC, NON REAC (03/18 0930)  HBsAg: NEGATIVE, NEGATIVE (03/18 0930)  HIV: NON REACTIVE (03/18 0930)  GBS:   Neg   Assessment/Plan: Active Labor at Term Admit to BS Expectant management    Alonda Weaber E. 06/12/2012, 1:03 AM

## 2012-06-12 NOTE — Progress Notes (Signed)
Christy Koch is a 26 y.o. G1P0000 at [redacted]w[redacted]d   Subjective: Pushing intermittently x 1 hour. Coping well.   Objective: BP 108/66  Pulse 98  Temp 98.3 F (36.8 C) (Oral)  Resp 20  Ht 5\' 6"  (1.676 m)  Wt 79.379 kg (175 lb)  BMI 28.25 kg/m2  LMP 09/11/2011   FHT:  FHR: 120 bpm, variability: moderate,  accelerations:  Present,  decelerations:  Absent UC:   regular, every 2-4 minutes SVE:   Dilation: Lip/rim Effacement (%): 100 Station: 0 Exam by:: Ivonne Andrew CNM At [employer]: Lab Results  Component Value Date   WBC 12.8* 06/12/2012   HGB 11.2* 06/12/2012   HCT 34.8* 06/12/2012   MCV 79.6 06/12/2012   PLT 220 06/12/2012    Assessment / Plan: Spontaneous labor, progressing normally  Labor: Progressing normally Preeclampsia:  NA Fetal Wellbeing:  Category I Pain Control:  Labor support without medications I/D:  n/a Anticipated MOD:  NSVD  Nazir Hacker 06/12/2012, 4:05 PM

## 2012-06-13 LAB — CBC
Hemoglobin: 10.1 g/dL — ABNORMAL LOW (ref 12.0–15.0)
MCHC: 33 g/dL (ref 30.0–36.0)
RBC: 3.9 MIL/uL (ref 3.87–5.11)
WBC: 13.1 10*3/uL — ABNORMAL HIGH (ref 4.0–10.5)

## 2012-06-13 MED ORDER — IBUPROFEN 600 MG PO TABS: 600.0000 mg | ORAL_TABLET | Freq: Four times a day (QID) | ORAL | Status: AC

## 2012-06-13 NOTE — Discharge Summary (Signed)
Plan to d/c home at 5pm when she will be 24 hours post delivery.  Obstetric Discharge Summary Reason for Admission: onset of labor Prenatal Procedures: none Intrapartum Procedures: spontaneous vaginal delivery Postpartum Procedures: none Complications-Operative and Postpartum: 2nd degree perineal laceration Hemoglobin  Date Value Range Status  06/13/2012 10.1* 12.0 - 15.0 g/dL Final     HCT  Date Value Range Status  06/13/2012 30.6* 36.0 - 46.0 % Final    Physical Exam:  General: alert, cooperative and no distress Heart: RRR Lungs: nl effort Lochia: appropriate Uterine Fundus: firm DVT Evaluation: No evidence of DVT seen on physical exam.  Discharge Diagnoses: Term Pregnancy-delivered  Discharge Information: Date: 06/13/2012 Activity: pelvic rest Diet: routine Medications: PNV and Ibuprofen Condition: stable Instructions: refer to practice specific booklet Discharge to: home Follow-up Information    Follow up with [facility]. (Make an appointment for 4-6 weeks postpartum)    Contact information:   [address] [place] [address]          Newborn Data: Live born female  Birth Weight: 8 lb 8.9 oz (3881 g) APGAR: 9, 9  Home with mother. Breastfeeding going well; declines contraception.  Cam Hai 06/13/2012, 7:43 AM

## 2012-06-13 NOTE — Progress Notes (Signed)
UR chart review completed.  

## 2012-06-13 NOTE — Progress Notes (Signed)
Patient has refused any pain meds and all vaccinations.

## 2012-06-14 NOTE — Discharge Summary (Signed)
Agree with above note.  Tori Cupps 06/14/2012 9:33 AM

## 2012-06-18 ENCOUNTER — Encounter: Payer: Medicaid Other | Admitting: Advanced Practice Midwife

## 2012-07-20 ENCOUNTER — Ambulatory Visit: Payer: Medicaid Other

## 2012-07-27 ENCOUNTER — Ambulatory Visit: Payer: Medicaid Other

## 2012-07-31 ENCOUNTER — Ambulatory Visit (INDEPENDENT_AMBULATORY_CARE_PROVIDER_SITE_OTHER): Payer: Medicaid Other | Admitting: Obstetrics & Gynecology

## 2012-07-31 ENCOUNTER — Encounter: Payer: Self-pay | Admitting: Obstetrics & Gynecology

## 2012-07-31 NOTE — Progress Notes (Signed)
  Subjective:     Christy Koch is a 26 y.o. G1P1 female who presents for a postpartum visit. She is 6 weeks postpartum following a spontaneous vaginal delivery. I have fully reviewed the prenatal and intrapartum course. The delivery was at 39 gestational weeks. Outcome: spontaneous vaginal delivery. Anesthesia: epidural. Postpartum course has been uncomplicated. Baby's course has been uncomplicated. Baby is feeding by breast. Bleeding no bleeding. Bowel function is normal. Bladder function is normal. Patient is sexually active. Contraception method is rhythm method. Postpartum depression screening: negative.  The following portions of the patient's history were reviewed and updated as appropriate: allergies, current medications, past family history, past medical history, past social history, past surgical history and problem list.  Review of Systems Pertinent items are noted in HPI.   Objective:    BP 122/76  Pulse 72  Resp 16  Ht 5\' 7"  (1.702 m)  Wt 153 lb (69.4 kg)  BMI 23.96 kg/m2  Breastfeeding? Yes  General:  alert and no distress   Breasts:  inspection negative, no nipple discharge or bleeding, no masses or nodularity palpable  Lungs: clear to auscultation bilaterally  Heart:  regular rate and rhythm  Abdomen: soft, non-tender; bowel sounds normal; no masses,  no organomegaly   Vulva:  normal  Vagina: normal vagina and well-healed laceration  Cervix:  no lesions  Corpus: normal size, contour, position, consistency, mobility, non-tender  Adnexa:  normal adnexa and no mass, fullness, tenderness  Rectal Exam: Not performed.        Assessment:    Normal postpartum exam. Pap smear not done at today's visit.   Plan:    1. Contraception: rhythm method 2. Up to date with pap smear; was done a few months prior to pregnancy. No history of cervical dysplasia. 3. Follow up as needed.

## 2012-07-31 NOTE — Patient Instructions (Signed)
  Place postpartum visit patient instructions here.  

## 2013-07-05 ENCOUNTER — Encounter: Payer: Self-pay | Admitting: Obstetrics and Gynecology

## 2013-07-08 ENCOUNTER — Ambulatory Visit (INDEPENDENT_AMBULATORY_CARE_PROVIDER_SITE_OTHER): Payer: Medicaid Other | Admitting: Family

## 2013-07-08 ENCOUNTER — Encounter: Payer: Self-pay | Admitting: Family

## 2013-07-08 VITALS — BP 105/67 | Wt 156.0 lb

## 2013-07-08 DIAGNOSIS — O0930 Supervision of pregnancy with insufficient antenatal care, unspecified trimester: Secondary | ICD-10-CM

## 2013-07-08 DIAGNOSIS — O09893 Supervision of other high risk pregnancies, third trimester: Secondary | ICD-10-CM

## 2013-07-08 DIAGNOSIS — O09899 Supervision of other high risk pregnancies, unspecified trimester: Secondary | ICD-10-CM

## 2013-07-08 DIAGNOSIS — O0933 Supervision of pregnancy with insufficient antenatal care, third trimester: Secondary | ICD-10-CM | POA: Insufficient documentation

## 2013-07-08 NOTE — Progress Notes (Signed)
New OB appt; previously baby delivered 06/12/13, Ivonne Andrew, CNM attendant; Lives in [place].  Desires to come to our practice for prenatal care and to birth at [facility].  Pt okay with the drive.  Currently breastfeeding.  Unable to complete physical exam and labs today due to time factor.  Pt will return in two weeks for ultrasound (appt for [place] after OB visit), exam, and jelly bean challenge.

## 2013-07-08 NOTE — Progress Notes (Signed)
p-86  Initial OB  This is her 2 nd pregnancy with [place].  Needs PN labs done at next visit

## 2013-07-10 DIAGNOSIS — O09899 Supervision of other high risk pregnancies, unspecified trimester: Secondary | ICD-10-CM | POA: Insufficient documentation

## 2013-07-19 ENCOUNTER — Other Ambulatory Visit (HOSPITAL_COMMUNITY)
Admission: RE | Admit: 2013-07-19 | Discharge: 2013-07-19 | Disposition: A | Discharge status: Home / Self Care | Payer: Medicaid Other | Source: Ambulatory Visit | Attending: Family | Admitting: Family

## 2013-07-19 ENCOUNTER — Ambulatory Visit (HOSPITAL_COMMUNITY)
Admission: RE | Admit: 2013-07-19 | Discharge: 2013-07-19 | Disposition: A | Discharge status: Home / Self Care | Payer: Medicaid Other | Source: Ambulatory Visit | Attending: Family | Admitting: Family

## 2013-07-19 ENCOUNTER — Ambulatory Visit (INDEPENDENT_AMBULATORY_CARE_PROVIDER_SITE_OTHER): Payer: Medicaid Other | Admitting: Family

## 2013-07-19 VITALS — BP 110/75 | Wt 160.0 lb

## 2013-07-19 DIAGNOSIS — O0933 Supervision of pregnancy with insufficient antenatal care, third trimester: Secondary | ICD-10-CM

## 2013-07-19 DIAGNOSIS — Z3480 Encounter for supervision of other normal pregnancy, unspecified trimester: Secondary | ICD-10-CM

## 2013-07-19 DIAGNOSIS — N760 Acute vaginitis: Secondary | ICD-10-CM | POA: Insufficient documentation

## 2013-07-19 DIAGNOSIS — Z3492 Encounter for supervision of normal pregnancy, unspecified, second trimester: Secondary | ICD-10-CM

## 2013-07-19 DIAGNOSIS — Z113 Encounter for screening for infections with a predominantly sexual mode of transmission: Secondary | ICD-10-CM | POA: Insufficient documentation

## 2013-07-19 DIAGNOSIS — Z01419 Encounter for gynecological examination (general) (routine) without abnormal findings: Secondary | ICD-10-CM | POA: Insufficient documentation

## 2013-07-19 DIAGNOSIS — O0930 Supervision of pregnancy with insufficient antenatal care, unspecified trimester: Secondary | ICD-10-CM

## 2013-07-19 DIAGNOSIS — Z3689 Encounter for other specified antenatal screening: Secondary | ICD-10-CM | POA: Insufficient documentation

## 2013-07-19 LAB — CBC
HCT: 35.3 % — ABNORMAL LOW (ref 36.0–46.0)
Hemoglobin: 12.1 g/dL (ref 12.0–15.0)
MCH: 28.6 pg (ref 26.0–34.0)
MCHC: 34.3 g/dL (ref 30.0–36.0)

## 2013-07-19 NOTE — Progress Notes (Signed)
p-98 

## 2013-07-19 NOTE — Progress Notes (Signed)
No problems or concerns; increased round ligament pain; no bleeding or LOF; jelly bean challenge completed today; [place] scheduled for today at [facility]. Exam   BP 110/75  Wt 160 lb (72.576 kg)  BMI 25.05 kg/m2  LMP 01/05/2013 Uterine Size: size equals dates  Pelvic Exam:    Perineum: No Hemorrhoids, Normal Perineum   Vulva: normal   Vagina:  normal mucosa, normal discharge, no palpable nodules   pH: Not done   Cervix: no bleeding following Pap, no cervical motion tenderness and no lesions   Adnexa: normal adnexa and no mass, fullness, tenderness   Bony Pelvis: Adequate  System: Breast:  No nipple retraction or dimpling, No nipple discharge or bleeding, No axillary or supraclavicular adenopathy, Normal to palpation without dominant masses   Skin: normal coloration and turgor, no rashes    Neurologic: negative   Extremities: normal strength, tone, and muscle mass   HEENT neck supple with midline trachea and thyroid without masses   Mouth/Teeth mucous membranes moist, pharynx normal without lesions   Neck supple and no masses   Cardiovascular: regular rate and rhythm, no murmurs or gallops   Respiratory:  appears well, vitals normal, no respiratory distress, acyanotic, normal RR, neck free of mass or lymphadenopathy, chest clear, no wheezing, crepitations, rhonchi, normal symmetric air entry   Abdomen: soft, non-tender; bowel sounds normal; no masses,  no organomegaly   Urinary: urethral meatus normal

## 2013-07-20 ENCOUNTER — Encounter: Payer: Self-pay | Admitting: Family

## 2013-07-20 ENCOUNTER — Other Ambulatory Visit: Payer: Self-pay | Admitting: Family

## 2013-07-20 DIAGNOSIS — B951 Streptococcus, group B, as the cause of diseases classified elsewhere: Secondary | ICD-10-CM | POA: Insufficient documentation

## 2013-07-20 DIAGNOSIS — O2340 Unspecified infection of urinary tract in pregnancy, unspecified trimester: Secondary | ICD-10-CM

## 2013-07-20 LAB — OBSTETRIC PANEL
Antibody Screen: NEGATIVE
Basophils Absolute: 0 10*3/uL (ref 0.0–0.1)
Basophils Relative: 0 % (ref 0–1)
Eosinophils Relative: 1 % (ref 0–5)
HCT: 35.3 % — ABNORMAL LOW (ref 36.0–46.0)
MCHC: 34.3 g/dL (ref 30.0–36.0)
MCV: 83.5 fL (ref 78.0–100.0)
Monocytes Absolute: 0.7 10*3/uL (ref 0.1–1.0)
RDW: 13.2 % (ref 11.5–15.5)
Rubella: 2.86 Index — ABNORMAL HIGH (ref <0.90)

## 2013-07-20 LAB — CULTURE, URINE COMPREHENSIVE: Colony Count: 30000

## 2013-07-20 MED ORDER — AMPICILLIN 500 MG PO CAPS: 500.0000 mg | ORAL_CAPSULE | Freq: Four times a day (QID) | ORAL | Status: DC

## 2013-07-20 NOTE — Progress Notes (Signed)
Amoxicillin ordered for patient and pt will be notified regarding +GBS in urine and need for treatment in labor.

## 2013-07-22 ENCOUNTER — Telehealth: Payer: Self-pay

## 2013-07-22 NOTE — Telephone Encounter (Signed)
Lab results sent to our office in error Spoke with Annice Pih at [facility] She will send results to ordering doctor

## 2013-07-22 NOTE — Telephone Encounter (Signed)
Message copied by Lestine Mount on Mon Jul 22, 2013  4:21 PM ------      Message from: Haylee Noon      Created: Sat Jul 20, 2013 10:41 PM       Please notify pt regarding GBS in urine and inquire about pharmacy to call in RX to - also inform need to be treated for GBS in labor.  Thank you!!            ----- Message -----         From: [facility] Interface         Sent: 07/19/2013   5:30 PM           To: Amahia Noon, CNM                   ------

## 2013-07-23 ENCOUNTER — Telehealth: Payer: Self-pay | Admitting: *Deleted

## 2013-07-23 NOTE — Telephone Encounter (Signed)
LM on cell phone that her 1 hr GTT was WNL.

## 2013-07-26 ENCOUNTER — Encounter: Payer: Self-pay | Admitting: Family

## 2013-08-16 ENCOUNTER — Ambulatory Visit (INDEPENDENT_AMBULATORY_CARE_PROVIDER_SITE_OTHER): Payer: Medicaid Other | Admitting: Family

## 2013-08-16 DIAGNOSIS — Z3480 Encounter for supervision of other normal pregnancy, unspecified trimester: Secondary | ICD-10-CM

## 2013-08-16 DIAGNOSIS — O26879 Cervical shortening, unspecified trimester: Secondary | ICD-10-CM | POA: Insufficient documentation

## 2013-08-16 DIAGNOSIS — O26873 Cervical shortening, third trimester: Secondary | ICD-10-CM

## 2013-08-16 MED ORDER — BETAMETHASONE SOD PHOS & ACET 6 (3-3) MG/ML IJ SUSP
12.0000 mg | Freq: Once | INTRAMUSCULAR | Status: AC
  Administered 2013-08-16: 12 mg via INTRAMUSCULAR

## 2013-08-16 MED ORDER — PROGESTERONE MICRONIZED 200 MG PO CAPS: ORAL_CAPSULE | ORAL | Status: DC

## 2013-08-16 NOTE — Progress Notes (Signed)
Recently moved, feeling a lot of pressure.  No bleeding or contractions.  Pelvic exam - uterus palpates low in pelvis, cervix posterior 1.5/50/-2; no contractions.  Consulted with Dr. Debroah Loop > BMZ series today, nightly prometrium; return to [facility] tomorrow for cervical length [place] and BMZ injection after noon.  Currently breastfeeding, slowly weaning; recommended may need to discontinue due to cervical dilation.

## 2013-08-16 NOTE — Progress Notes (Signed)
P-96 

## 2013-08-17 ENCOUNTER — Inpatient Hospital Stay (HOSPITAL_COMMUNITY)
Admission: AD | Admit: 2013-08-17 | Discharge: 2013-08-17 | Disposition: A | Discharge status: Home / Self Care | Payer: Medicaid Other | Source: Ambulatory Visit | Attending: Obstetrics & Gynecology | Admitting: Obstetrics & Gynecology

## 2013-08-17 ENCOUNTER — Inpatient Hospital Stay (HOSPITAL_COMMUNITY): Payer: Medicaid Other

## 2013-08-17 DIAGNOSIS — O4700 False labor before 37 completed weeks of gestation, unspecified trimester: Secondary | ICD-10-CM | POA: Insufficient documentation

## 2013-08-17 MED ORDER — BETAMETHASONE SOD PHOS & ACET 6 (3-3) MG/ML IJ SUSP
12.0000 mg | Freq: Once | INTRAMUSCULAR | Status: AC
  Administered 2013-08-17: 12 mg via INTRAMUSCULAR
  Filled 2013-08-17: qty 2

## 2013-08-17 NOTE — MAU Provider Note (Signed)
Christy Koch is a 27 y.o. G38P1001 female @ [redacted]w[redacted]d who is here today for 2nd bmz and u/s for cervical length after being seen at [facility] yesterday and cx 1.5/50/-2. She received her 1st dose of bmz and was placed on nightly prometrium yesterday. Recently moved and has been lifting boxes, taking long walks pushing old in stroller, breastfeeding- has been weaning- states decided to stop all together yesterday. Denies uc's, vb, lof. Reports good fm. Increased non-odorous d/c x ~4wks, no vulvovaginal itching/irritation, uti s/s.  H/O 39wk birth 14 months ago  O: BP 100/63  Pulse 80  LMP 01/05/2013      U/S report: CL 2.5cm, AFV: subjectively normal limits, cephalic, fhr 160     Received 2nd bmz today  A: [redacted]w[redacted]d [facility]      G2P1001       Preterm cervical shortening/dilation  P: Discussed w/ Dr. Macon Large:       D/C home      Modified bedrest, pelvic rest      Increase po fluids, empty bladder as needed      Continue prometrium nightly      Reviewed ptl s/s, fkc      Keep appt as scheduled on Friday at [facility]      Return if needed  Cheral Marker, CNM, [facility] 08/17/2013 4:34 PM

## 2013-08-17 NOTE — MAU Note (Signed)
Pt presents for 2nd dose of betamethasone and U/S for cervical length.

## 2013-08-17 NOTE — MAU Provider Note (Signed)

## 2013-08-23 ENCOUNTER — Ambulatory Visit (INDEPENDENT_AMBULATORY_CARE_PROVIDER_SITE_OTHER): Payer: Medicaid Other | Admitting: Family

## 2013-08-23 ENCOUNTER — Ambulatory Visit (HOSPITAL_COMMUNITY)
Admission: RE | Admit: 2013-08-23 | Discharge: 2013-08-23 | Disposition: A | Discharge status: Home / Self Care | Payer: Medicaid Other | Source: Ambulatory Visit | Attending: Family | Admitting: Family

## 2013-08-23 ENCOUNTER — Other Ambulatory Visit: Payer: Self-pay | Admitting: Family

## 2013-08-23 VITALS — BP 111/76 | Wt 155.0 lb

## 2013-08-23 DIAGNOSIS — Z8751 Personal history of pre-term labor: Secondary | ICD-10-CM | POA: Insufficient documentation

## 2013-08-23 DIAGNOSIS — Z3689 Encounter for other specified antenatal screening: Secondary | ICD-10-CM | POA: Insufficient documentation

## 2013-08-23 DIAGNOSIS — O0990 Supervision of high risk pregnancy, unspecified, unspecified trimester: Secondary | ICD-10-CM

## 2013-08-23 NOTE — Progress Notes (Signed)
Reports success at decreasing activity and breastfeeding.  Ultrasound scheduled for today at 12:15; cervix check today difficult compared to last week, cervix higher in pelvis, more posterior, 1/thick/hi; continue current activity.

## 2013-08-28 ENCOUNTER — Encounter: Payer: Self-pay | Admitting: Family

## 2013-09-13 ENCOUNTER — Ambulatory Visit (INDEPENDENT_AMBULATORY_CARE_PROVIDER_SITE_OTHER): Payer: Medicaid Other | Admitting: Family

## 2013-09-13 ENCOUNTER — Other Ambulatory Visit: Payer: Self-pay | Admitting: Family

## 2013-09-13 VITALS — BP 101/70 | Wt 156.0 lb

## 2013-09-13 DIAGNOSIS — Z3493 Encounter for supervision of normal pregnancy, unspecified, third trimester: Secondary | ICD-10-CM

## 2013-09-13 DIAGNOSIS — Z3480 Encounter for supervision of other normal pregnancy, unspecified trimester: Secondary | ICD-10-CM

## 2013-09-13 NOTE — Progress Notes (Signed)
P-83 

## 2013-09-13 NOTE — Progress Notes (Signed)
Doing well; more pressure, but definitely feeling more refreshed.  GBS collected today.  Plans to come to office when questioning labor for cervix check and will stay at friends home in [place].  Plans to sign consent at hospital in [facility] in case labor progresses quickly and need to go there.  Weaning has gone well.

## 2013-09-17 LAB — CULTURE, BETA STREP (GROUP B ONLY)

## 2013-09-18 ENCOUNTER — Telehealth: Payer: Self-pay | Admitting: *Deleted

## 2013-09-18 NOTE — Telephone Encounter (Signed)
Called pt to adv positive group B - no answer and no voicemail

## 2013-09-27 ENCOUNTER — Ambulatory Visit (INDEPENDENT_AMBULATORY_CARE_PROVIDER_SITE_OTHER): Payer: Medicaid Other | Admitting: Family

## 2013-09-27 VITALS — BP 103/72 | Wt 158.0 lb

## 2013-09-27 DIAGNOSIS — Z3480 Encounter for supervision of other normal pregnancy, unspecified trimester: Secondary | ICD-10-CM

## 2013-09-27 DIAGNOSIS — Z3493 Encounter for supervision of normal pregnancy, unspecified, third trimester: Secondary | ICD-10-CM

## 2013-09-27 NOTE — Progress Notes (Signed)
Feeling great; husband got a job in [place], will move next summer.  Reviewed GBS results and need for antibiotics.

## 2013-09-27 NOTE — Progress Notes (Signed)
p-77 

## 2013-10-07 ENCOUNTER — Encounter: Payer: Medicaid Other | Admitting: Advanced Practice Midwife

## 2014-05-13 ENCOUNTER — Encounter (HOSPITAL_COMMUNITY): Payer: Self-pay | Admitting: *Deleted

## 2014-10-06 ENCOUNTER — Encounter (HOSPITAL_COMMUNITY): Payer: Self-pay | Admitting: *Deleted

## 2015-05-08 ENCOUNTER — Encounter: Payer: Self-pay | Admitting: Family

## 2015-05-08 ENCOUNTER — Ambulatory Visit (INDEPENDENT_AMBULATORY_CARE_PROVIDER_SITE_OTHER): Payer: Medicaid Other | Admitting: Family

## 2015-05-08 VITALS — BP 95/65 | HR 84 | Wt 144.0 lb

## 2015-05-08 DIAGNOSIS — O0990 Supervision of high risk pregnancy, unspecified, unspecified trimester: Secondary | ICD-10-CM | POA: Insufficient documentation

## 2015-05-08 DIAGNOSIS — O09211 Supervision of pregnancy with history of pre-term labor, first trimester: Secondary | ICD-10-CM

## 2015-05-08 DIAGNOSIS — O09219 Supervision of pregnancy with history of pre-term labor, unspecified trimester: Secondary | ICD-10-CM | POA: Insufficient documentation

## 2015-05-08 DIAGNOSIS — O0991 Supervision of high risk pregnancy, unspecified, first trimester: Secondary | ICD-10-CM

## 2015-05-08 NOTE — Progress Notes (Signed)
   Subjective:    Christy Koch is a G3P2002 6153w4d being seen today for her first obstetrical visit.  Her obstetrical history is significant for preterm cervical dilation with delivery at term.  Pt was breastfeeding at the time.  Symptoms resolved after breastfeeding stopped.  Patient does intend to breast feed. Currently breastfeeding 8818 month old. Pregnancy history fully reviewed.  Patient reports fatigue and nausea.  Filed Vitals:   05/08/15 1048  BP: 95/65  Pulse: 84  Weight: 144 lb (65.318 kg)    HISTORY: OB History  Gravida Para Term Preterm AB SAB TAB Ectopic Multiple Living  3 2 2  0 0 0 0 0 0 2    # Outcome Date GA Lbr Len/2nd Weight Sex Delivery Anes PTL Lv  3 Current           2 Term 10/03/13 3064w5d  7 lb 11 oz (3.487 kg) F Vag-Spont  Y Y     Comments: System Generated. Please review and update pregnancy details.  1 Term 06/12/12 3769w2d 21:37 / 00:31 8 lb 8.9 oz (3.881 kg) M Vag-Spont Local  Y     History reviewed. No pertinent past medical history. History reviewed. No pertinent past surgical history. Family History  Problem Relation Age of Onset  . Varicose Veins Mother   . Cancer Maternal Grandmother   . Diabetes Neg Hx   . Hypertension Neg Hx      Exam    BP 95/65 mmHg  Pulse 84  Wt 144 lb (65.318 kg)  LMP 03/09/2015 (Approximate) Uterine Size: size equals dates  Pelvic Exam:   System: Breast:  No nipple retraction or dimpling, No nipple discharge or bleeding, No axillary or supraclavicular adenopathy, Normal to palpation without dominant masses   Skin: normal coloration and turgor, no rashes    Neurologic: negative   Extremities: normal strength, tone, and muscle mass   HEENT neck supple with midline trachea and thyroid without masses   Mouth/Teeth mucous membranes moist, pharynx normal without lesions   Neck supple and no masses   Cardiovascular: regular rate and rhythm, no murmurs or gallops   Respiratory:  appears well, vitals normal, no  respiratory distress, acyanotic, normal RR, neck free of mass or lymphadenopathy, chest clear, no wheezing, crepitations, rhonchi, normal symmetric air entry   Abdomen: soft, non-tender; bowel sounds normal; no masses,  no organomegaly        Assessment:    Pregnancy:   29 y.o. G3P2002 at 5653w4d wks IUP Patient Active Problem List   Diagnosis Date Noted  . Supervision of high-risk pregnancy 05/08/2015  . History of preterm labor, current pregnancy 05/08/2015        Plan:     Initial labs and pap smear deferred per pt request - pt desires to see about insurance. Prenatal vitamins. Problem list reviewed and updated. Genetic Screening discussed First Screen: requested.  Pt desires to wait for insurance.  Understands need to be completed during a certain period.    Follow up in 4 weeks.   Marlis EdelsonKARIM, Austin Pongratz N 05/08/2015

## 2015-05-08 NOTE — Progress Notes (Signed)
Bedside ultrasound performed. FHR=178 CRL- 18.459mm.

## 2015-08-24 ENCOUNTER — Ambulatory Visit (INDEPENDENT_AMBULATORY_CARE_PROVIDER_SITE_OTHER): Payer: Medicaid Other | Admitting: Advanced Practice Midwife

## 2015-08-24 ENCOUNTER — Other Ambulatory Visit (HOSPITAL_COMMUNITY)
Admission: RE | Admit: 2015-08-24 | Discharge: 2015-08-24 | Disposition: A | Discharge status: Home / Self Care | Payer: Medicaid Other | Source: Ambulatory Visit | Attending: Advanced Practice Midwife | Admitting: Advanced Practice Midwife

## 2015-08-24 ENCOUNTER — Encounter: Payer: Self-pay | Admitting: Advanced Practice Midwife

## 2015-08-24 VITALS — BP 100/60 | HR 84 | Wt 158.0 lb

## 2015-08-24 DIAGNOSIS — N898 Other specified noninflammatory disorders of vagina: Secondary | ICD-10-CM

## 2015-08-24 DIAGNOSIS — O0992 Supervision of high risk pregnancy, unspecified, second trimester: Secondary | ICD-10-CM | POA: Diagnosis not present

## 2015-08-24 DIAGNOSIS — Z1151 Encounter for screening for human papillomavirus (HPV): Secondary | ICD-10-CM | POA: Insufficient documentation

## 2015-08-24 DIAGNOSIS — Z3482 Encounter for supervision of other normal pregnancy, second trimester: Secondary | ICD-10-CM

## 2015-08-24 DIAGNOSIS — Z113 Encounter for screening for infections with a predominantly sexual mode of transmission: Secondary | ICD-10-CM | POA: Diagnosis present

## 2015-08-24 DIAGNOSIS — R102 Pelvic and perineal pain: Secondary | ICD-10-CM

## 2015-08-24 DIAGNOSIS — O26892 Other specified pregnancy related conditions, second trimester: Secondary | ICD-10-CM | POA: Diagnosis not present

## 2015-08-24 DIAGNOSIS — Z01419 Encounter for gynecological examination (general) (routine) without abnormal findings: Secondary | ICD-10-CM | POA: Insufficient documentation

## 2015-08-24 DIAGNOSIS — N949 Unspecified condition associated with female genital organs and menstrual cycle: Secondary | ICD-10-CM

## 2015-08-24 NOTE — Patient Instructions (Signed)
Preterm Labor Information °Preterm labor is when labor starts at less than 37 weeks of pregnancy. The normal length of a pregnancy is 39 to 41 weeks. °CAUSES °Often, there is no identifiable underlying cause as to why a woman goes into preterm labor. One of the most common known causes of preterm labor is infection. Infections of the uterus, cervix, vagina, amniotic sac, bladder, kidney, or even the lungs (pneumonia) can cause labor to start. Other suspected causes of preterm labor include:  °· Urogenital infections, such as yeast infections and bacterial vaginosis.   °· Uterine abnormalities (uterine shape, uterine septum, fibroids, or bleeding from the placenta).   °· A cervix that has been operated on (it may fail to stay closed).   °· Malformations in the fetus.   °· Multiple gestations (twins, triplets, and so on).   °· Breakage of the amniotic sac.   °RISK FACTORS °· Having a previous history of preterm labor.   °· Having premature rupture of membranes (PROM).   °· Having a placenta that covers the opening of the cervix (placenta previa).   °· Having a placenta that separates from the uterus (placental abruption).   °· Having a cervix that is too weak to hold the fetus in the uterus (incompetent cervix).   °· Having too much fluid in the amniotic sac (polyhydramnios).   °· Taking illegal drugs or smoking while pregnant.   °· Not gaining enough weight while pregnant.   °· Being younger than 18 and older than 30 years old.   °· Having a low socioeconomic status.   °· Being African American. °SYMPTOMS °Signs and symptoms of preterm labor include:  °· Menstrual-like cramps, abdominal pain, or back pain. °· Uterine contractions that are regular, as frequent as six in an hour, regardless of their intensity (may be mild or painful). °· Contractions that start on the top of the uterus and spread down to the lower abdomen and back.   °· A sense of increased pelvic pressure.   °· A watery or bloody mucus discharge that  comes from the vagina.   °TREATMENT °Depending on the length of the pregnancy and other circumstances, your health care Dandrae Kustra may suggest bed rest. If necessary, there are medicines that can be given to stop contractions and to mature the fetal lungs. If labor happens before 34 weeks of pregnancy, a prolonged hospital stay may be recommended. Treatment depends on the condition of both you and the fetus.  °WHAT SHOULD YOU DO IF YOU THINK YOU ARE IN PRETERM LABOR? °Call your health care Juanell Saffo right away. You will need to go to the hospital to get checked immediately. °HOW CAN YOU PREVENT PRETERM LABOR IN FUTURE PREGNANCIES? °You should:  °· Stop smoking if you smoke.  °· Maintain healthy weight gain and avoid chemicals and drugs that are not necessary. °· Be watchful for any type of infection. °· Inform your health care Otha Rickles if you have a known history of preterm labor. °Document Released: 02/11/2004 Document Revised: 07/24/2013 Document Reviewed: 12/24/2012 °ExitCare® Patient Information ©2015 [employer]. This information is not intended to replace advice given to you by your health care Avo Schlachter. Make sure you discuss any questions you have with your health care Harlie Buening. ° °

## 2015-08-24 NOTE — Progress Notes (Signed)
Subjective:  Christy Koch is a 29 y.o. G3P2002 at [redacted]w[redacted]d being seen today for ongoing prenatal care.  Patient reports no bleeding, no leaking and vaginal discharge and pelvic pressure.  Contractions: Not present.  Vag. Bleeding: None. Movement: present. Denies leaking of fluid.   The following portions of the patient's history were reviewed and updated as appropriate: allergies, current medications, past family history, past medical history, past social history, past surgical history and problem list.   Objective:   Filed Vitals:   08/24/15 1057  BP: 100/60  Pulse: 84  Weight: 158 lb (71.668 kg)    Fetal Status: Fetal Heart Rate (bpm): 154 Fundal Height: 24 cm Movement: Absent     General:  Alert, oriented and cooperative. Patient is in no acute distress.  Skin: Skin is warm and dry. No rash noted.   Cardiovascular: Normal heart rate noted  Respiratory: Normal respiratory effort, no problems with respiration noted  Abdomen: Soft, gravid, appropriate for gestational age. Pain/Pressure: Present     Pelvic: Vag. Bleeding: None Vag D/C Character: White   Cervical exam performed Dilation: Closed Effacement (%): 0 Station: Ballotable  Extremities: Normal range of motion.  Edema: None  Mental Status: Normal mood and affect. Normal behavior. Normal judgment and thought content.   Urinalysis: Urine Protein: Negative Urine Glucose: Negative  Assessment and Plan:  Pregnancy: G3P2002 at [redacted]w[redacted]d  1. Normal pregnancy in multigravida in second trimester  - Cytology - PAP - Fetal fibronectin - WET PREP FOR TRICH, YEAST, CLUE - CULTURE, URINE COMPREHENSIVE - Prenatal (OB Panel) - HIV antibody (with reflex) - [place] MFM OB COMP + 14 WK; Future  2. Supervision of high-risk pregnancy, second trimester   Preterm labor symptoms and general obstetric precautions including but not limited to vaginal bleeding, contractions, leaking of fluid and fetal movement were reviewed in detail with the  patient. Please refer to After Visit Summary for other counseling recommendations.  Return in about 4 weeks (around 09/21/2015) for ROB and GTT.  Increase fluids and rest. BMZ if fFN pos  Dorathy Kinsman, CNM

## 2015-08-25 ENCOUNTER — Telehealth: Payer: Self-pay | Admitting: *Deleted

## 2015-08-25 LAB — OBSTETRIC PANEL
Antibody Screen: NEGATIVE
BASOS ABS: 0 10*3/uL (ref 0.0–0.1)
Basophils Relative: 0 % (ref 0–1)
Eosinophils Absolute: 0.1 10*3/uL (ref 0.0–0.7)
Eosinophils Relative: 1 % (ref 0–5)
HCT: 34.9 % — ABNORMAL LOW (ref 36.0–46.0)
HEMOGLOBIN: 11.8 g/dL — AB (ref 12.0–15.0)
HEP B S AG: NEGATIVE
LYMPHS ABS: 1.5 10*3/uL (ref 0.7–4.0)
LYMPHS PCT: 19 % (ref 12–46)
MCH: 29.1 pg (ref 26.0–34.0)
MCHC: 33.8 g/dL (ref 30.0–36.0)
MCV: 86.2 fL (ref 78.0–100.0)
MPV: 9.8 fL (ref 8.6–12.4)
Monocytes Absolute: 0.5 10*3/uL (ref 0.1–1.0)
Monocytes Relative: 6 % (ref 3–12)
NEUTROS ABS: 5.9 10*3/uL (ref 1.7–7.7)
NEUTROS PCT: 74 % (ref 43–77)
Platelets: 244 10*3/uL (ref 150–400)
RBC: 4.05 MIL/uL (ref 3.87–5.11)
RDW: 13.8 % (ref 11.5–15.5)
RUBELLA: 3.96 {index} — AB (ref <0.90)
Rh Type: POSITIVE
WBC: 8 10*3/uL (ref 4.0–10.5)

## 2015-08-25 LAB — WET PREP FOR TRICH, YEAST, CLUE
Clue Cells Wet Prep HPF POC: NONE SEEN
Trich, Wet Prep: NONE SEEN
Yeast Wet Prep HPF POC: NONE SEEN

## 2015-08-25 LAB — FETAL FIBRONECTIN: Fetal Fibronectin: NEGATIVE

## 2015-08-25 LAB — CYTOLOGY - PAP

## 2015-08-25 LAB — HIV ANTIBODY (ROUTINE TESTING W REFLEX): HIV: NONREACTIVE

## 2015-08-25 NOTE — Telephone Encounter (Signed)
-----   Message from [place], [place] sent at 08/25/2015 12:01 PM EDT ----- fFN and wet prep neg. Please notify pt.

## 2015-08-25 NOTE — Telephone Encounter (Signed)
LM on voicemail of neg FFN and wet prep.

## 2015-08-28 LAB — CULTURE, URINE COMPREHENSIVE: Colony Count: 30000

## 2015-09-02 ENCOUNTER — Other Ambulatory Visit: Payer: Self-pay | Admitting: Advanced Practice Midwife

## 2015-09-02 ENCOUNTER — Ambulatory Visit (HOSPITAL_COMMUNITY)
Admission: RE | Admit: 2015-09-02 | Discharge: 2015-09-02 | Disposition: A | Discharge status: Home / Self Care | Payer: Medicaid Other | Source: Ambulatory Visit | Attending: Advanced Practice Midwife | Admitting: Advanced Practice Midwife

## 2015-09-02 DIAGNOSIS — Z36 Encounter for antenatal screening of mother: Secondary | ICD-10-CM | POA: Insufficient documentation

## 2015-09-02 DIAGNOSIS — Z3689 Encounter for other specified antenatal screening: Secondary | ICD-10-CM

## 2015-09-02 DIAGNOSIS — Z3A25 25 weeks gestation of pregnancy: Secondary | ICD-10-CM | POA: Diagnosis not present

## 2015-09-02 DIAGNOSIS — O0992 Supervision of high risk pregnancy, unspecified, second trimester: Secondary | ICD-10-CM

## 2015-09-02 DIAGNOSIS — O09212 Supervision of pregnancy with history of pre-term labor, second trimester: Secondary | ICD-10-CM

## 2015-09-02 DIAGNOSIS — Z3482 Encounter for supervision of other normal pregnancy, second trimester: Secondary | ICD-10-CM

## 2015-09-13 IMAGING — US US OB TRANSVAGINAL
1 series · 14 of 15 positions shown · non-contrast
Comparison: none

[Series 1: us ob transvaginal · 14 of 15 slices shown]
[im 1/15]
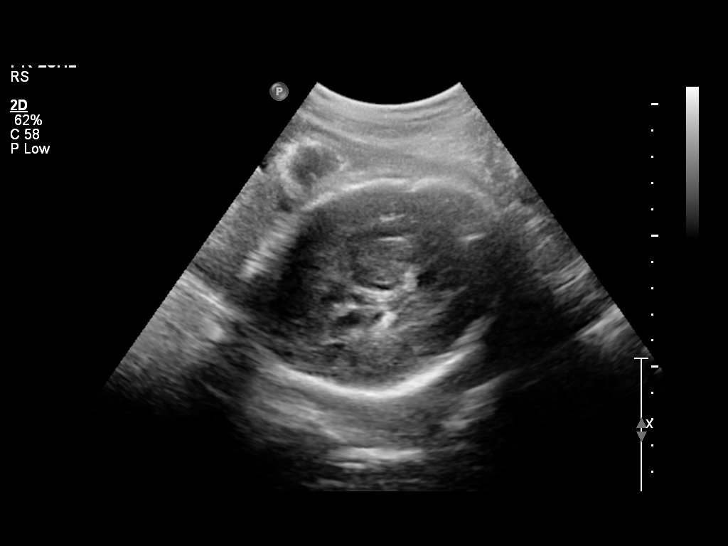
[im 2/15]
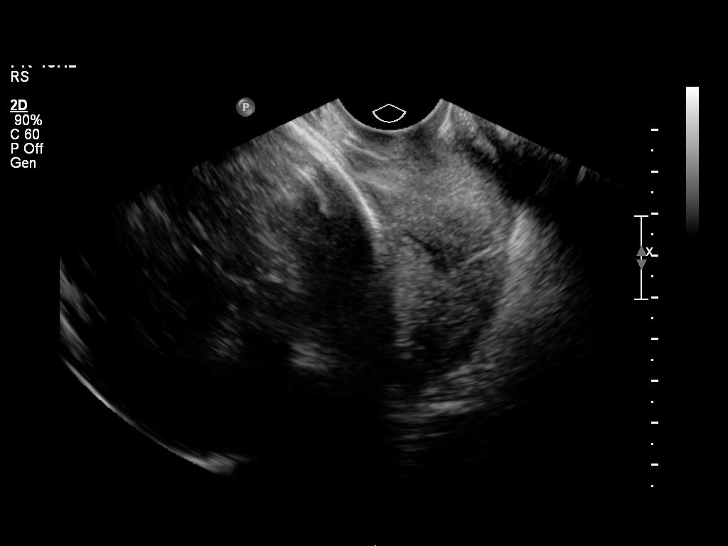
[im 3/15]
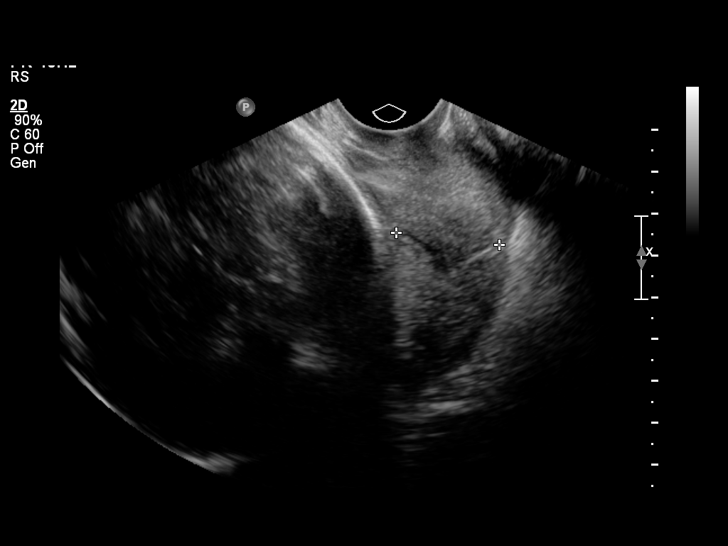
[im 4/15]
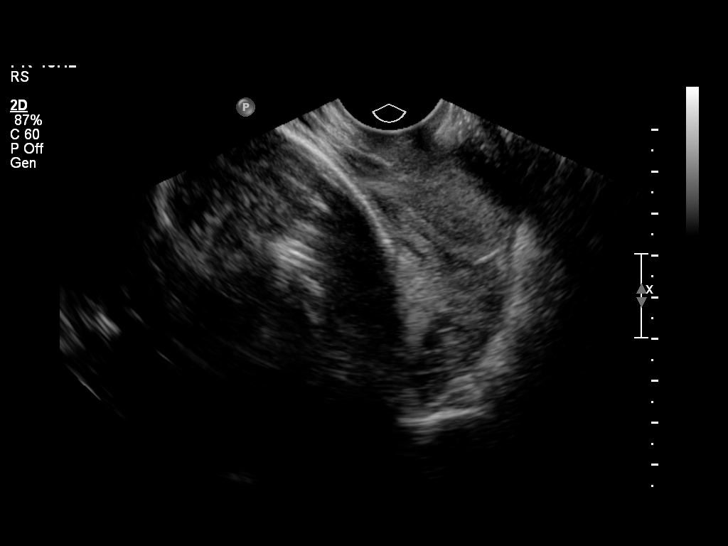
[im 5/15]
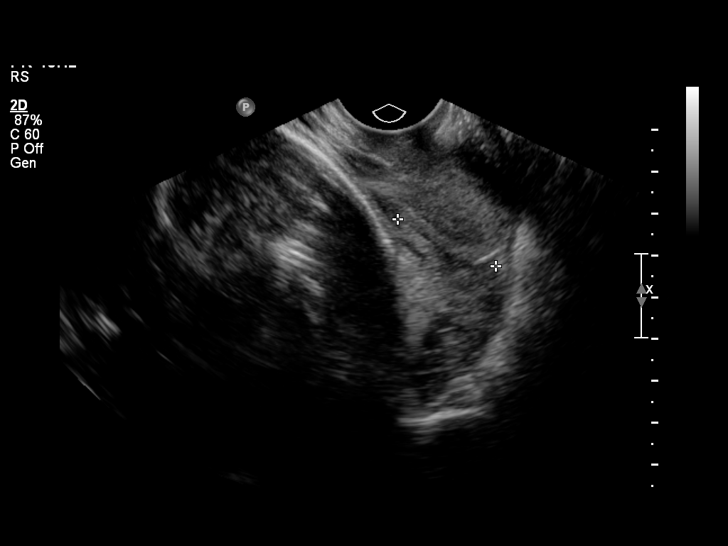
[im 6/15]
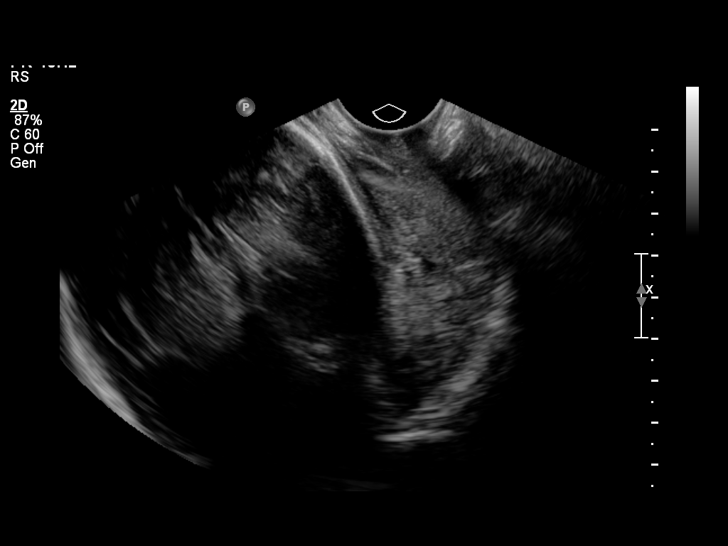
[im 7/15]
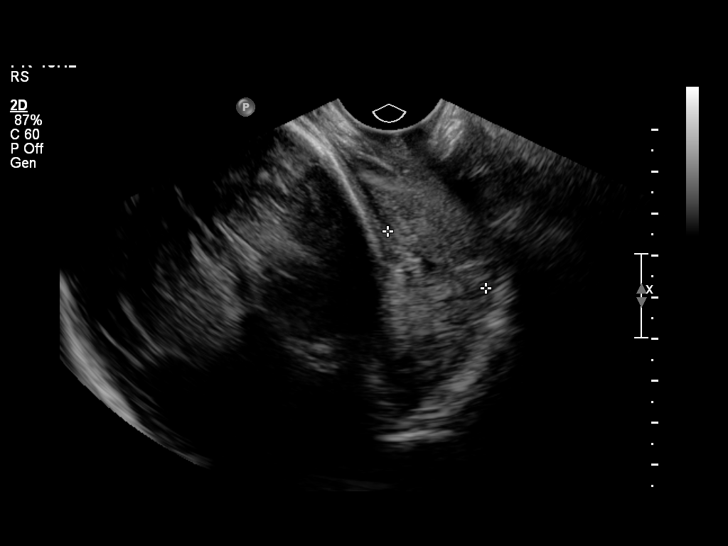
[im 9/15]
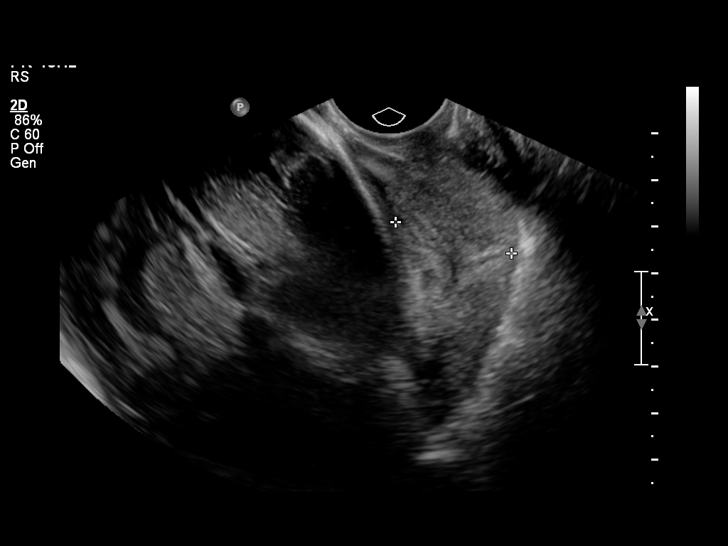
[im 10/15]
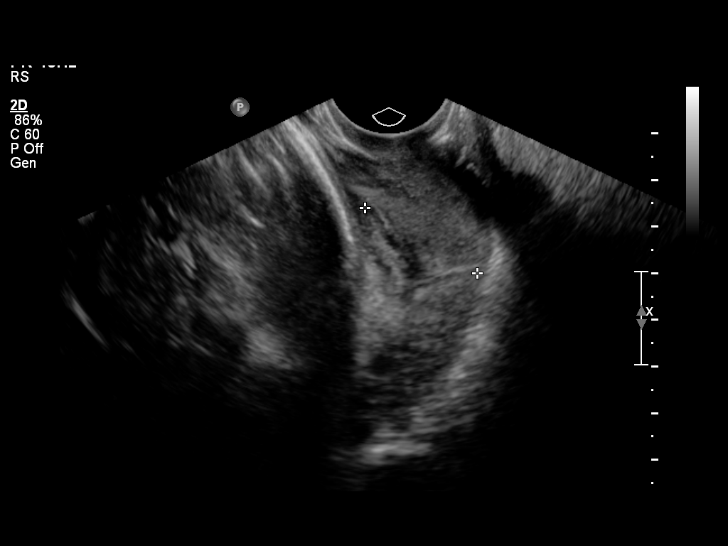
[im 11/15]
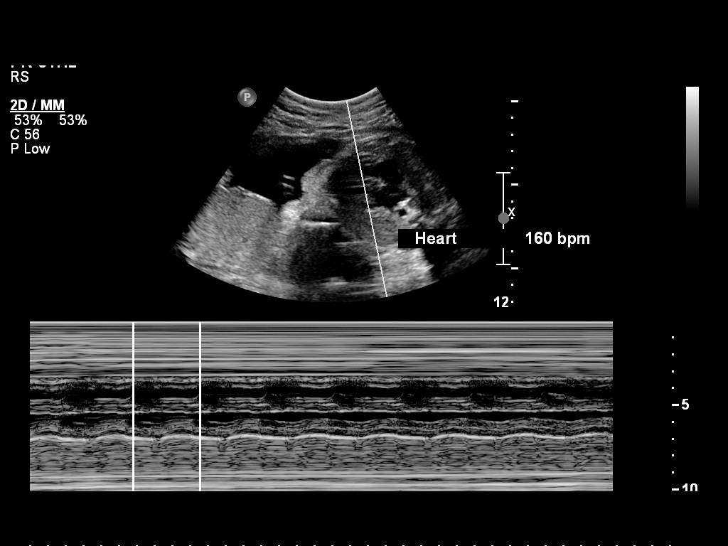
[im 12/15]
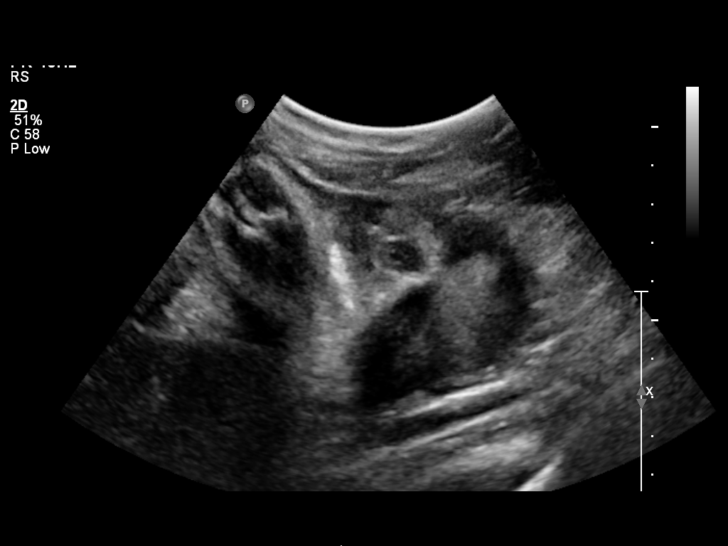
[im 13/15]
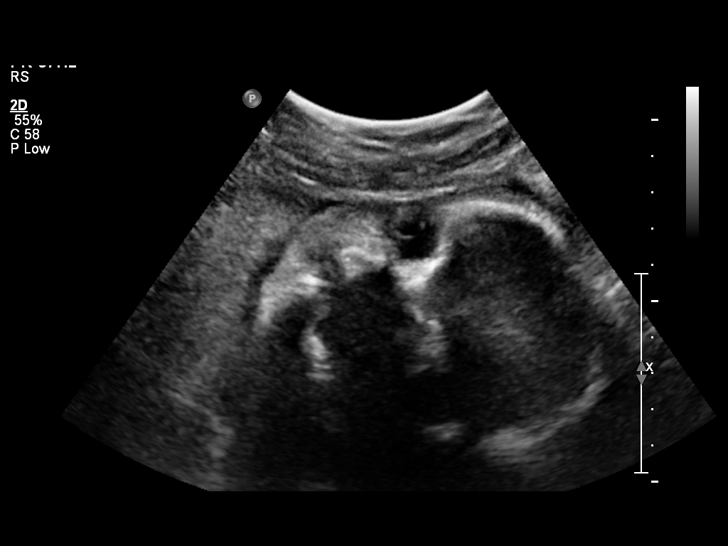
[im 14/15]
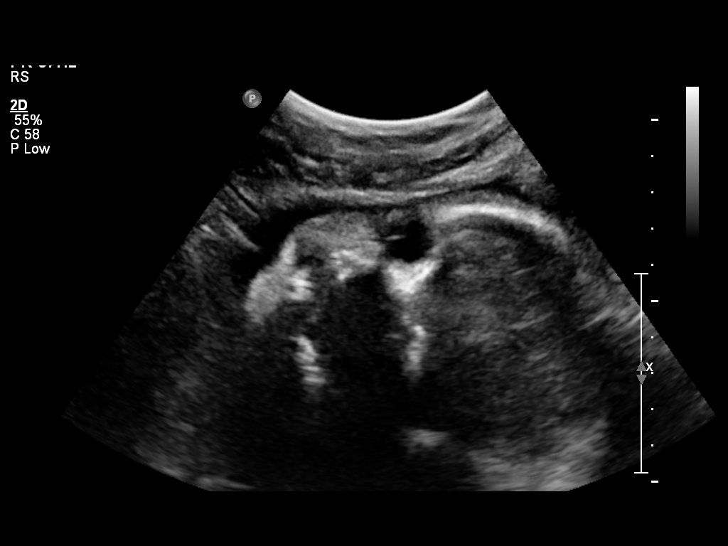
[im 15/15]
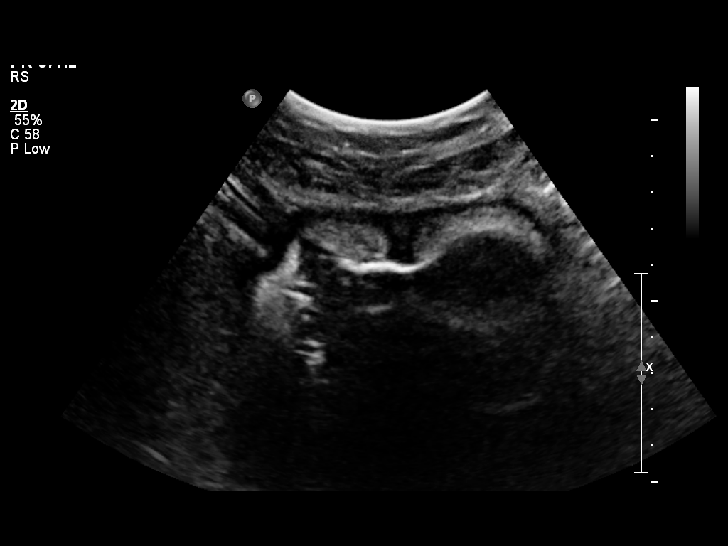

[14 of 15 positions shown; findings below may reference images not displayed]

OBSTETRICS REPORT
                      (Signed Final 08/17/2013 [DATE])

Service(s) Provided

 US OB TRANSVAGINAL                                    76817.0
Indications

 Cervical shortening
Fetal Evaluation

 Num Of Fetuses:    1
 Fetal Heart Rate:  160                          bpm
 Cardiac Activity:  Observed
 Presentation:      Cephalic

 Amniotic Fluid
 AFI FV:      Subjectively within normal limits
Gestational Age

 LMP:           32w 0d        Date:  01/05/13                 EDD:    10/12/13
 Best:          32w 0d     Det. By:  LMP  (01/05/13)          EDD:    10/12/13
Cervix Uterus Adnexa

 Cervical Length:    2.8       cm

 Cervix:       Measured transvaginally.
Impression

 SIUP at 32+0 weeks
 EV views of cervix: CL of 2.8 cms; head applied to cervix; no
 funneling visualized
Recommendations

 Follow-up ultrasounds as clinically indicated.

 questions or concerns.

## 2015-09-19 IMAGING — US US OB TRANSVAGINAL
1 series · 9 of 9 positions shown · non-contrast
Comparison: none

[Series 1: us ob transvaginal · 9 of 9 slices shown]
[im 1/9]
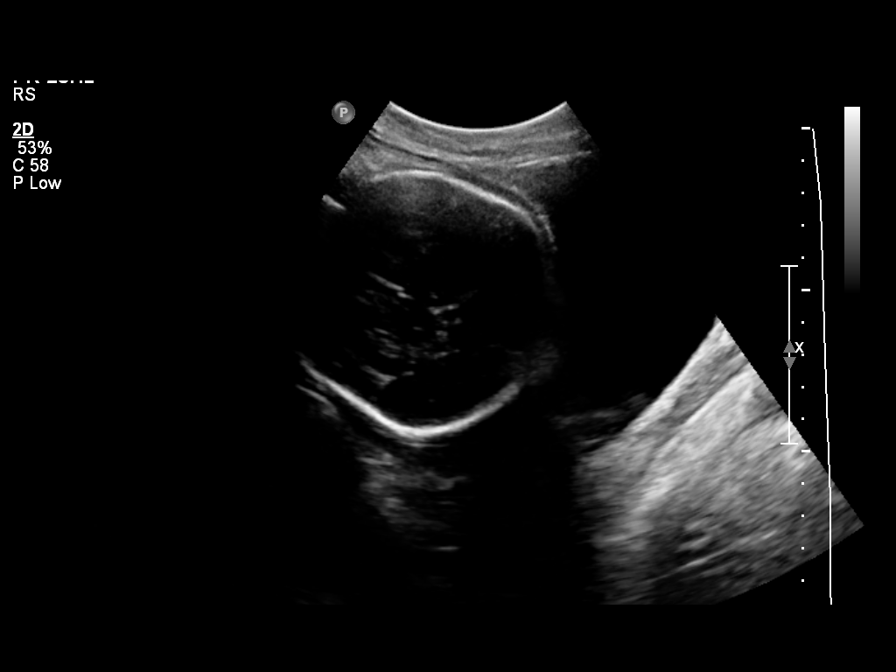
[im 2/9]
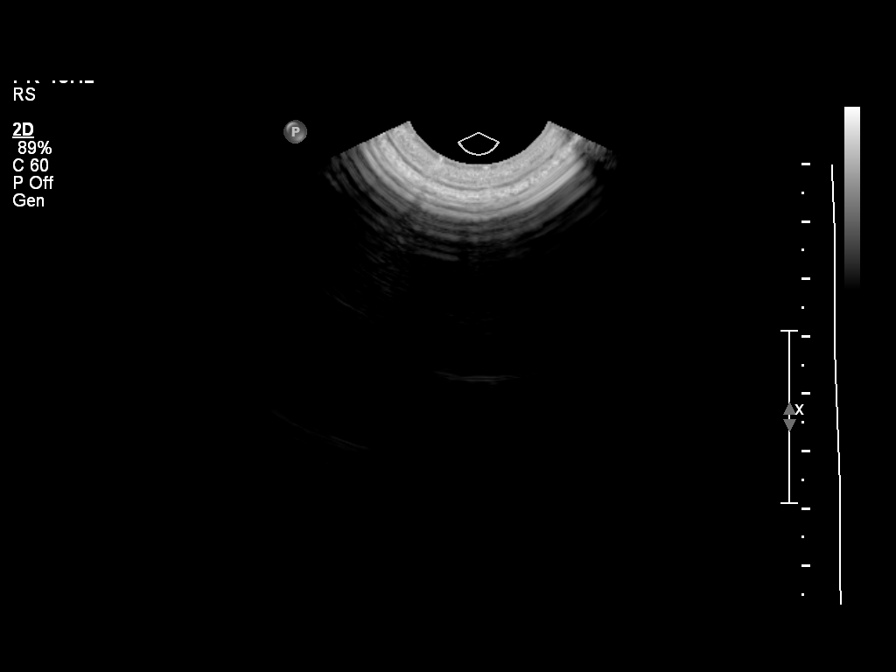
[im 3/9]
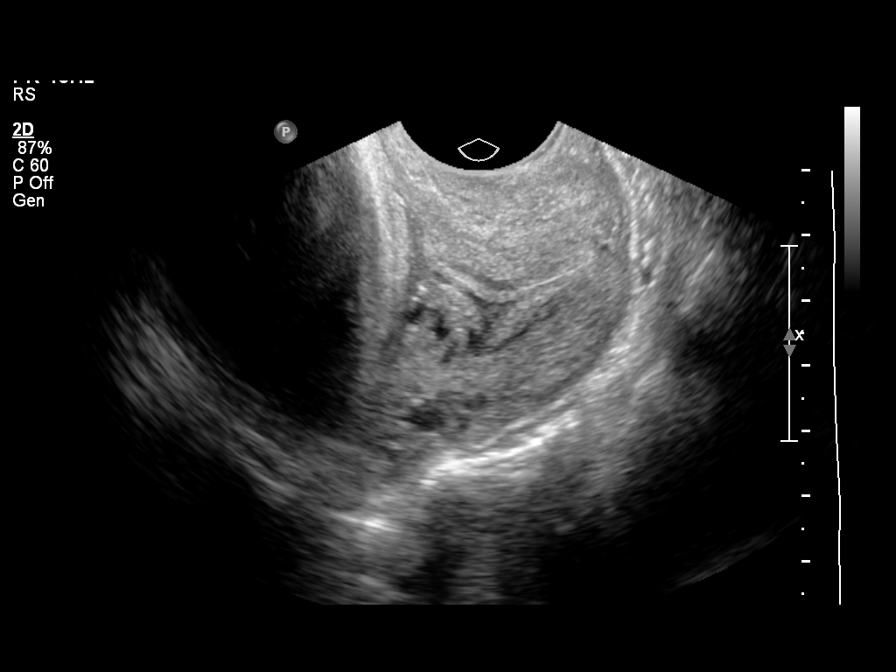
[im 4/9]
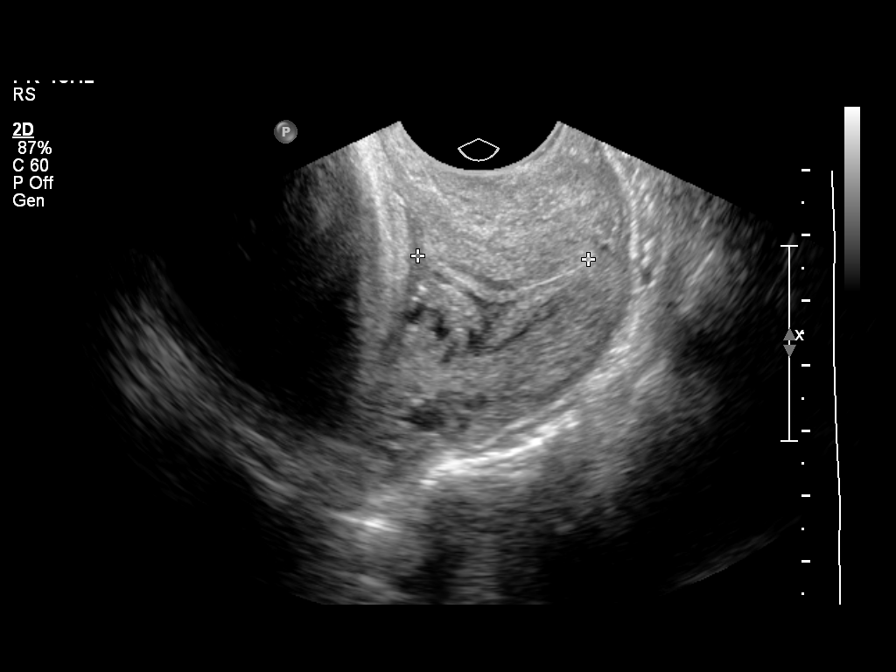
[im 5/9]
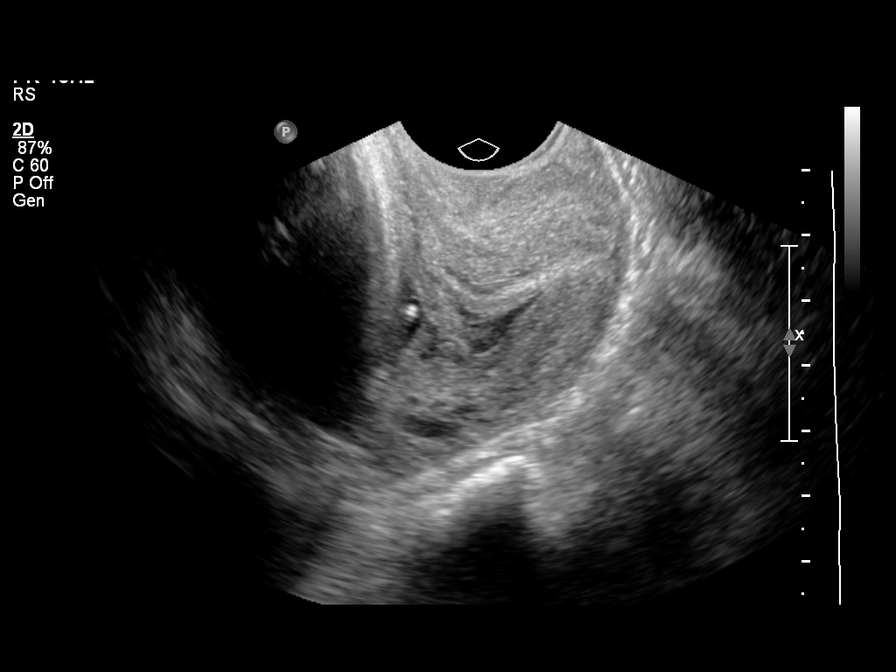
[im 6/9]
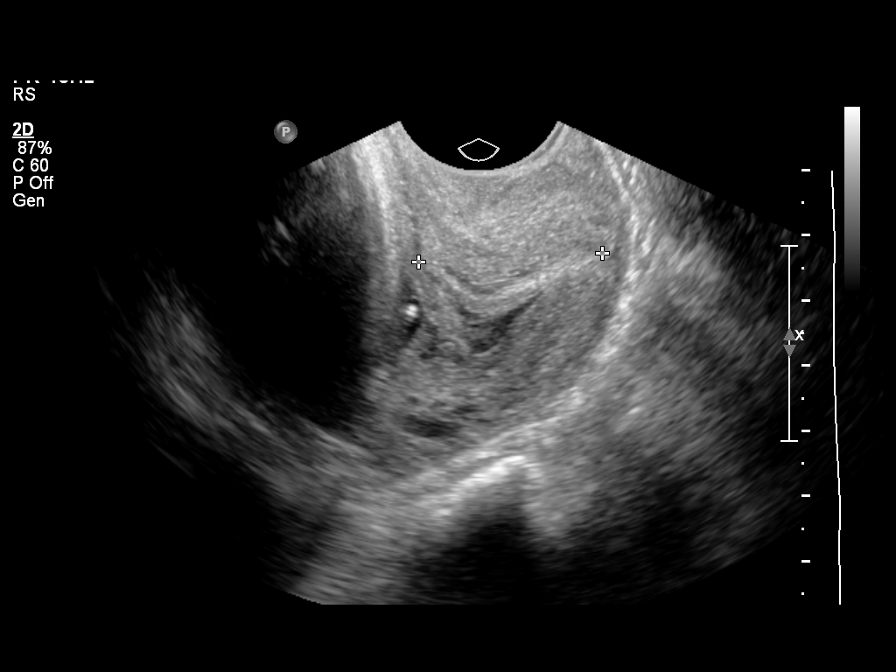
[im 7/9]
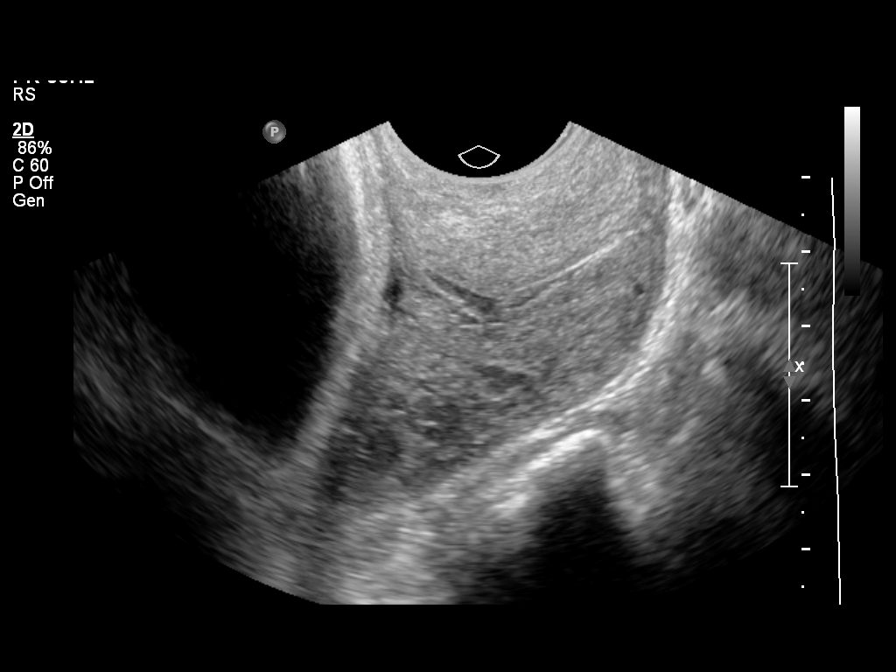
[im 8/9]
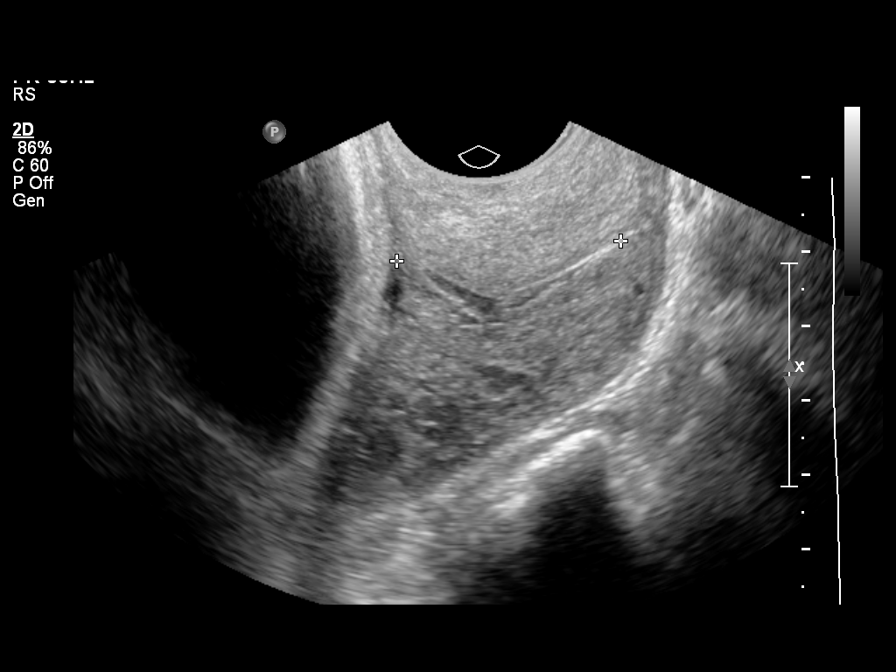
[im 9/9]
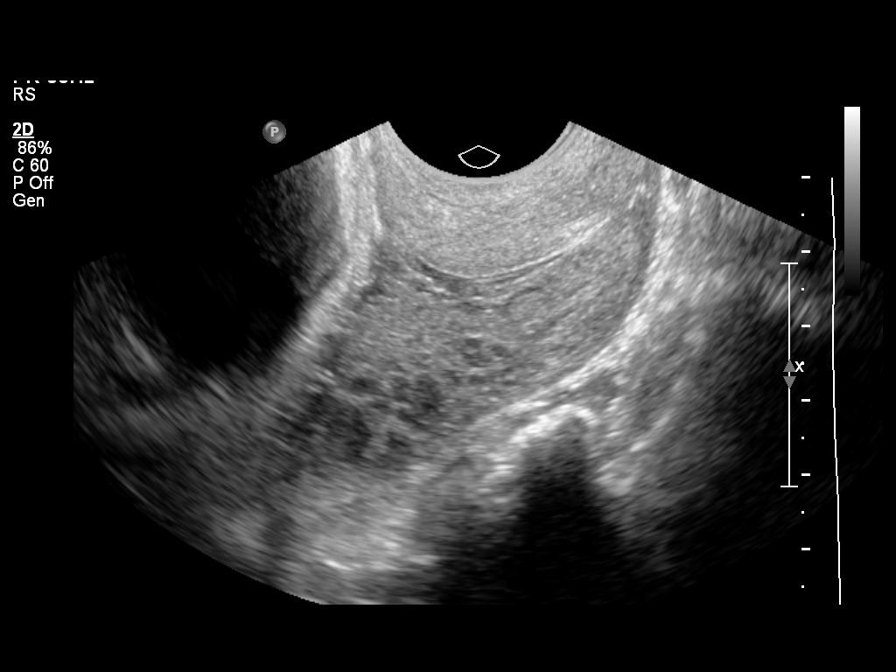

[9 of 9 positions shown; findings below may reference images not displayed]

OBSTETRICS REPORT
                      (Signed Final 08/23/2013 [DATE])

Service(s) Provided

 US OB TRANSVAGINAL                                    76817.0
Indications

 Previous pre-term deliveries
Fetal Evaluation

 Num Of Fetuses:    1
 Fetal Heart Rate:  150                          bpm
 Cardiac Activity:  Observed
 Presentation:      Cephalic
Gestational Age

 LMP:           32w 6d        Date:  01/05/13                 EDD:   10/12/13
 Best:          32w 6d     Det. By:  LMP  (01/05/13)          EDD:   10/12/13
Cervix Uterus Adnexa

 Cervical Length:    3.3       cm

 Cervix:       Normal appearance by transvaginal scan
Impression

 IUP at  32+6 weeks
 EV views of cervix: normal length without funneling
Recommendations

 Would follow cervix clinically instead of US

 questions or concerns.

## 2015-09-25 ENCOUNTER — Encounter: Payer: Medicaid Other | Admitting: Family

## 2015-09-29 ENCOUNTER — Encounter: Payer: Self-pay | Admitting: *Deleted

## 2015-10-02 ENCOUNTER — Ambulatory Visit (INDEPENDENT_AMBULATORY_CARE_PROVIDER_SITE_OTHER): Payer: Medicaid Other | Admitting: Advanced Practice Midwife

## 2015-10-02 VITALS — BP 112/65 | HR 85 | Wt 164.0 lb

## 2015-10-02 DIAGNOSIS — O0993 Supervision of high risk pregnancy, unspecified, third trimester: Secondary | ICD-10-CM

## 2015-10-02 DIAGNOSIS — IMO0002 Reserved for concepts with insufficient information to code with codable children: Secondary | ICD-10-CM

## 2015-10-02 DIAGNOSIS — O0933 Supervision of pregnancy with insufficient antenatal care, third trimester: Secondary | ICD-10-CM

## 2015-10-02 DIAGNOSIS — Z0489 Encounter for examination and observation for other specified reasons: Secondary | ICD-10-CM

## 2015-10-02 DIAGNOSIS — Z36 Encounter for antenatal screening of mother: Secondary | ICD-10-CM

## 2015-10-02 NOTE — Patient Instructions (Signed)

## 2015-10-07 DIAGNOSIS — O0933 Supervision of pregnancy with insufficient antenatal care, third trimester: Secondary | ICD-10-CM | POA: Insufficient documentation

## 2015-10-07 NOTE — Progress Notes (Signed)
Subjective:  Christy PiedraMelissa Koch is a 29 y.o. G3P2002 at 6065w2d being seen today for ongoing prenatal care.  Patient reports occasional contractions. < 2 per hour. Contractions: Not present.  Vag. Bleeding: None. Movement: Present. Denies leaking of fluid.   The following portions of the patient's history were reviewed and updated as appropriate: allergies, current medications, past family history, past medical history, past social history, past surgical history and problem list. Problem list updated.  Objective:   Filed Vitals:   10/02/15 1019  BP: 112/65  Pulse: 85  Weight: 164 lb (74.39 kg)    Fetal Status: Fetal Heart Rate (bpm): 152   Movement: Present     General:  Alert, oriented and cooperative. Patient is in no acute distress.  Skin: Skin is warm and dry. No rash noted.   Cardiovascular: Normal heart rate noted  Respiratory: Normal respiratory effort, no problems with respiration noted  Abdomen: Soft, gravid, appropriate for gestational age. Pain/Pressure: Present     Pelvic: Vag. Bleeding: None Vag D/C Character: Thin   Cervical exam deferred        Extremities: Normal range of motion.  Edema: None  Mental Status: Normal mood and affect. Normal behavior. Normal judgment and thought content.   Urinalysis: Urine Protein: Negative Urine Glucose: Negative  Assessment and Plan:  Pregnancy: G3P2002 at 6265w2d  1. Supervision of high-risk pregnancy, third trimester  - US MFM OB FOLLOW UP; Future  2. Evaluate anatomy not seen on prior sonogram  - US MFM OB FOLLOW UP; Future  Preterm labor symptoms and general obstetric precautions including but not limited to vaginal bleeding, contractions, leaking of fluid and fetal movement were reviewed in detail with the patient. Please refer to After Visit Summary for other counseling recommendations.  Many questions about hospital policy, waterbirth, preterm labor, what is normal fetal mvmt. All discussed at length. Encouraged to sign up for  waterbirth class ASAP if interested.   Return in about 2 weeks (around 10/16/2015).   Dorathy KinsmanVirginia Meggan Dhaliwal, CNM

## 2015-10-16 ENCOUNTER — Encounter: Payer: Medicaid Other | Admitting: Certified Nurse Midwife

## 2015-10-23 ENCOUNTER — Encounter: Payer: Medicaid Other | Admitting: Obstetrics and Gynecology

## 2015-11-06 ENCOUNTER — Ambulatory Visit (INDEPENDENT_AMBULATORY_CARE_PROVIDER_SITE_OTHER): Payer: Medicaid Other | Admitting: Family

## 2015-11-06 VITALS — BP 107/71 | HR 95 | Wt 166.0 lb

## 2015-11-06 DIAGNOSIS — O0993 Supervision of high risk pregnancy, unspecified, third trimester: Secondary | ICD-10-CM

## 2015-11-06 NOTE — Progress Notes (Signed)
Subjective:  Christy Koch is a 29 y.o. G3P2002 at 3770w4d being seen today for ongoing prenatal care.  She is currently monitored for the following issues for this high-risk pregnancy and has Supervision of high-risk pregnancy; History of preterm labor, current pregnancy; and Limited prenatal care in third trimester on her problem list.  Patient reports resolving yeast infection.  Contractions: Irregular. Vag. Bleeding: None.  Movement: Present. Denies leaking of fluid.   The following portions of the patient's history were reviewed and updated as appropriate: allergies, current medications, past family history, past medical history, past social history, past surgical history and problem list. Problem list updated.  Objective:   Filed Vitals:   11/06/15 1038  BP: 107/71  Pulse: 95  Weight: 166 lb (75.297 kg)    Fetal Status: Fetal Heart Rate (bpm): 144 Fundal Height: 34 cm Movement: Present     General:  Alert, oriented and cooperative. Patient is in no acute distress.  Skin: Skin is warm and dry. No rash noted.   Cardiovascular: Normal heart rate noted  Respiratory: Normal respiratory effort, no problems with respiration noted  Abdomen: Soft, gravid, appropriate for gestational age. Pain/Pressure: Present     Pelvic: Vag. Bleeding: None Vag D/C Character: Thin   Cervical exam performed Dilation: Closed Effacement (%): Thick    Extremities: Normal range of motion.  Edema: None  Mental Status: Normal mood and affect. Normal behavior. Normal judgment and thought content.   Urinalysis:    Protein negative Glucose negative  Assessment and Plan:  Pregnancy: G3P2002 at 8870w4d  1. Supervision of high-risk pregnancy, third trimester  Preterm labor symptoms and general obstetric precautions including but not limited to vaginal bleeding, contractions, leaking of fluid and fetal movement were reviewed in detail with the patient. Please refer to After Visit Summary for other counseling  recommendations.  Return in about 2 weeks (around 11/20/2015).   Eino FarberWalidah Kennith GainN Karim, CNM

## 2015-11-13 ENCOUNTER — Encounter (HOSPITAL_COMMUNITY): Payer: Self-pay

## 2015-11-13 ENCOUNTER — Inpatient Hospital Stay (HOSPITAL_COMMUNITY)
Admission: AD | Admit: 2015-11-13 | Discharge: 2015-11-14 | Disposition: A | Discharge status: Home / Self Care | Payer: Medicaid Other | Source: Ambulatory Visit | Attending: Obstetrics & Gynecology | Admitting: Obstetrics & Gynecology

## 2015-11-13 DIAGNOSIS — O479 False labor, unspecified: Secondary | ICD-10-CM | POA: Diagnosis not present

## 2015-11-13 DIAGNOSIS — O26893 Other specified pregnancy related conditions, third trimester: Secondary | ICD-10-CM | POA: Diagnosis present

## 2015-11-13 DIAGNOSIS — Z3A35 35 weeks gestation of pregnancy: Secondary | ICD-10-CM | POA: Diagnosis not present

## 2015-11-13 DIAGNOSIS — O4703 False labor before 37 completed weeks of gestation, third trimester: Secondary | ICD-10-CM | POA: Diagnosis not present

## 2015-11-13 LAB — URINALYSIS, ROUTINE W REFLEX MICROSCOPIC
Bilirubin Urine: NEGATIVE
GLUCOSE, UA: NEGATIVE mg/dL
KETONES UR: NEGATIVE mg/dL
Nitrite: NEGATIVE
PROTEIN: NEGATIVE mg/dL
Specific Gravity, Urine: 1.01 (ref 1.005–1.030)
pH: 7 (ref 5.0–8.0)

## 2015-11-13 LAB — URINE MICROSCOPIC-ADD ON

## 2015-11-13 NOTE — Discharge Instructions (Signed)
Braxton Hicks Contractions °Contractions of the uterus can occur throughout pregnancy. Contractions are not always a sign that you are in labor.  °WHAT ARE BRAXTON HICKS CONTRACTIONS?  °Contractions that occur before labor are called Braxton Hicks contractions, or false labor. Toward the end of pregnancy (32-34 weeks), these contractions can develop more often and may become more forceful. This is not true labor because these contractions do not result in opening (dilatation) and thinning of the cervix. They are sometimes difficult to tell apart from true labor because these contractions can be forceful and people have different pain tolerances. You should not feel embarrassed if you go to the hospital with false labor. Sometimes, the only way to tell if you are in true labor is for your health care provider to look for changes in the cervix. °If there are no prenatal problems or other health problems associated with the pregnancy, it is completely safe to be sent home with false labor and await the onset of true labor. °HOW CAN YOU TELL THE DIFFERENCE BETWEEN TRUE AND FALSE LABOR? °False Labor °· The contractions of false labor are usually shorter and not as hard as those of true labor.   °· The contractions are usually irregular.   °· The contractions are often felt in the front of the lower abdomen and in the groin.   °· The contractions may go away when you walk around or change positions while lying down.   °· The contractions get weaker and are shorter lasting as time goes on.   °· The contractions do not usually become progressively stronger, regular, and closer together as with true labor.   °True Labor °· Contractions in true labor last 30-70 seconds, become very regular, usually become more intense, and increase in frequency.   °· The contractions do not go away with walking.   °· The discomfort is usually felt in the top of the uterus and spreads to the lower abdomen and low back.   °· True labor can be  determined by your health care provider with an exam. This will show that the cervix is dilating and getting thinner.   °WHAT TO REMEMBER °· Keep up with your usual exercises and follow other instructions given by your health care provider.   °· Take medicines as directed by your health care provider.   °· Keep your regular prenatal appointments.   °· Eat and drink lightly if you think you are going into labor.   °· If Braxton Hicks contractions are making you uncomfortable:   °¨ Change your position from lying down or resting to walking, or from walking to resting.   °¨ Sit and rest in a tub of warm water.   °¨ Drink 2-3 glasses of water. Dehydration may cause these contractions.   °¨ Do slow and deep breathing several times an hour.   °WHEN SHOULD I SEEK IMMEDIATE MEDICAL CARE? °Seek immediate medical care if: °· Your contractions become stronger, more regular, and closer together.   °· You have fluid leaking or gushing from your vagina.   °· You have a fever.   °· You pass blood-tinged mucus.   °· You have vaginal bleeding.   °· You have continuous abdominal pain.   °· You have low back pain that you never had before.   °· You feel your baby's head pushing down and causing pelvic pressure.   °· Your baby is not moving as much as it used to.   °  °This information is not intended to replace advice given to you by your health care provider. Make sure you discuss any questions you have with your health care   provider. °  °Document Released: 11/21/2005 Document Revised: 11/26/2013 Document Reviewed: 09/02/2013 °Elsevier Interactive Patient Education ©2016 [employer] ° °

## 2015-11-13 NOTE — MAU Note (Signed)
Notified provider that patient presents with c/o of panic attacks that she says happens when she is about to go into labor. No LOF. Strip reactive. Provider said she would be down to see the patient.

## 2015-11-13 NOTE — MAU Note (Signed)
Yesterday very stressful and probably dehydrated. Had some cramping last night and early am. Did not sleep well. About 1700 felt alittle "unsettled" again and unsure if may be contracting and felt alittle anxious. Some pelvic pressure. Denies LOF or bleeding. Baby very active last couple days

## 2015-11-13 NOTE — MAU Provider Note (Signed)
  History     CSN: [id number]  Arrival date and time: 11/13/15 2122   None     No chief complaint on file.  HPI [name] is a 29yo [id number] @ 35.4wks who presents for eval of nonspecific symptoms that mostly occurred yesterday, including increased FM, nausea and feeling flushed. No specific reg ctx pattern, bldg, or leaking. No fever or dysuria.  Was mostly concerned re what she describes as a silent labor/unusual symptomatology with her second child, who was born 15 mins after arriving at facility. [facility] and has been essentially unremarkable.  OB History    Gravida Para Term Preterm AB TAB SAB Ectopic Multiple Living   3 2 2  0 0 0 0 0 0 2      History reviewed. No pertinent past medical history.  Past Surgical History  Procedure Laterality Date  . Wisdom tooth extraction      Family History  Problem Relation Age of Onset  . Varicose Veins Mother   . Cancer Maternal Grandmother   . Diabetes Neg Hx   . Hypertension Neg Hx     Social History  Substance Use Topics  . Smoking status: Never Smoker   . Smokeless tobacco: None  . Alcohol Use: No    Allergies: No Known Allergies  Prescriptions prior to admission  Medication Sig Dispense Refill Last Dose  . Prenatal Vit-Fe Fumarate-FA (PRENATAL 19) tablet Chew 1 tablet by mouth daily.   Taking    ROS Physical Exam   Blood pressure 111/69, pulse 85, temperature 97.4 F (36.3 C), temperature source Oral, resp. rate 18, height 5\' 6"  (1.676 m), weight 75.388 kg (166 lb 3.2 oz), last menstrual period 03/09/2015, unknown if currently breastfeeding.  Physical Exam  Constitutional: She is oriented to person, place, and time. She appears well-developed.  HENT:  Head: Normocephalic.  Neck: Normal range of motion.  Cardiovascular: Normal rate.   Respiratory: Effort normal.  GI:  EFM 130-140s, +accels, no decels No ctx per toco/palp  Genitourinary: Vagina normal.  Cx C/L   Musculoskeletal: Normal range of motion.  Neurological: She is alert and oriented to person, place, and time.  Skin: Skin is warm and dry.  Psychiatric: She has a normal mood and affect. Her behavior is normal. Thought content normal.   Urinalysis    Component Value Date/Time   COLORURINE YELLOW 11/13/2015 2138   APPEARANCEUR CLEAR 11/13/2015 2138   LABSPEC 1.010 11/13/2015 2138   PHURINE 7.0 11/13/2015 2138   GLUCOSEU NEGATIVE 11/13/2015 2138   HGBUR TRACE* 11/13/2015 2138   BILIRUBINUR NEGATIVE 11/13/2015 2138   KETONESUR NEGATIVE 11/13/2015 2138   PROTEINUR NEGATIVE 11/13/2015 2138   NITRITE NEGATIVE 11/13/2015 2138   LEUKOCYTESUR MODERATE* 11/13/2015 2138   Micro: sm SE, sm bacteria   MAU Course  Procedures  MDM Cx exam NST read  Assessment and Plan  IUP@35 .4wks Braxton Hicks ctx  Reassured pt re status today Comfort measures rev'd Keep next scheduled OB appt, or come in sooner prn  Cam HaiSHAW, Wiatt Mahabir CNM 11/13/2015, 11:24 PM

## 2015-11-20 ENCOUNTER — Encounter: Payer: Self-pay | Admitting: *Deleted

## 2015-11-20 ENCOUNTER — Ambulatory Visit (INDEPENDENT_AMBULATORY_CARE_PROVIDER_SITE_OTHER): Payer: Medicaid Other | Admitting: Family

## 2015-11-20 ENCOUNTER — Other Ambulatory Visit (HOSPITAL_COMMUNITY)
Admission: RE | Admit: 2015-11-20 | Discharge: 2015-11-20 | Disposition: A | Discharge status: Home / Self Care | Payer: Medicaid Other | Source: Ambulatory Visit | Attending: Family | Admitting: Family

## 2015-11-20 VITALS — BP 113/70 | HR 83 | Wt 166.0 lb

## 2015-11-20 DIAGNOSIS — Z113 Encounter for screening for infections with a predominantly sexual mode of transmission: Secondary | ICD-10-CM | POA: Diagnosis present

## 2015-11-20 DIAGNOSIS — O09213 Supervision of pregnancy with history of pre-term labor, third trimester: Secondary | ICD-10-CM

## 2015-11-20 DIAGNOSIS — Z3483 Encounter for supervision of other normal pregnancy, third trimester: Secondary | ICD-10-CM

## 2015-11-20 DIAGNOSIS — O0993 Supervision of high risk pregnancy, unspecified, third trimester: Secondary | ICD-10-CM

## 2015-11-20 DIAGNOSIS — Z3490 Encounter for supervision of normal pregnancy, unspecified, unspecified trimester: Secondary | ICD-10-CM

## 2015-11-20 LAB — OB RESULTS CONSOLE GC/CHLAMYDIA: Gonorrhea: NEGATIVE

## 2015-11-20 LAB — OB RESULTS CONSOLE GBS: STREP GROUP B AG: POSITIVE

## 2015-11-20 NOTE — Progress Notes (Signed)
Feels like she has yeast infection

## 2015-11-20 NOTE — Progress Notes (Signed)
Subjective:  Christy Koch is a 29 y.o. G3P2002 at 2677w4d being seen today for ongoing prenatal care.  She is currently monitored for the following issues for this low-risk pregnancy and has Supervision of high-risk pregnancy; History of preterm labor, current pregnancy; and Limited prenatal care in third trimester on her problem list.  Patient reports no complaints.  Contractions: Irregular. Vag. Bleeding: None.  Movement: Present. Denies leaking of fluid.   The following portions of the patient's history were reviewed and updated as appropriate: allergies, current medications, past family history, past medical history, past social history, past surgical history and problem list. Problem list updated.  Objective:   Filed Vitals:   11/20/15 1103  BP: 113/70  Pulse: 83  Weight: 166 lb (75.297 kg)    Fetal Status:   Fundal Height: 36 cm Movement: Present    FHR 146  General:  Alert, oriented and cooperative. Patient is in no acute distress.  Skin: Skin is warm and dry. No rash noted.   Cardiovascular: Normal heart rate noted  Respiratory: Normal respiratory effort, no problems with respiration noted  Abdomen: Soft, gravid, appropriate for gestational age. Pain/Pressure: Present     Pelvic: Vag. Bleeding: None Vag D/C Character: Thin   Cervical exam deferred        Extremities: Normal range of motion.  Edema: None  Mental Status: Normal mood and affect. Normal behavior. Normal judgment and thought content.   Urinalysis: Urine Protein: Negative Urine Glucose: Negative  Assessment and Plan:  Pregnancy: G3P2002 at 3277w4d  1. Normal pregnancy, antepartum - Culture, beta strep (group b only) - GC[facility]Chlamydia probe amp ([place]   2. History of preterm labor, current pregnancy, third trimester - Continue monitoring  3.  Vaginal Irritation - - WET PREP FOR TRICH, YEAST, CLUE    Preterm labor symptoms and general obstetric precautions including but not limited to  vaginal bleeding, contractions, leaking of fluid and fetal movement were reviewed in detail with the patient. Please refer to After Visit Summary for other counseling recommendations.  Return in about 1 week (around 11/27/2015).   Eino FarberWalidah Kennith GainN Karim, CNM

## 2015-11-21 LAB — WET PREP FOR TRICH, YEAST, CLUE
Trich, Wet Prep: NONE SEEN
Yeast Wet Prep HPF POC: NONE SEEN

## 2015-11-21 LAB — CULTURE, BETA STREP (GROUP B ONLY)

## 2015-11-23 LAB — GC/CHLAMYDIA PROBE AMP (~~LOC~~) NOT AT ARMC
CHLAMYDIA, DNA PROBE: NEGATIVE
NEISSERIA GONORRHEA: NEGATIVE

## 2015-11-24 ENCOUNTER — Other Ambulatory Visit: Payer: Self-pay | Admitting: Family

## 2015-11-24 ENCOUNTER — Telehealth: Payer: Self-pay | Admitting: *Deleted

## 2015-11-24 MED ORDER — METRONIDAZOLE 500 MG PO TABS: 500.0000 mg | ORAL_TABLET | Freq: Two times a day (BID) | ORAL | Status: DC

## 2015-11-24 NOTE — Telephone Encounter (Signed)
-----   Message from Marlis EdelsonWalidah N Karim, CNM sent at 11/24/2015  9:00 AM EST ----- Lala LundHey Jawana Reagor, can you let her know I sent an RX to pharmacy for BV.  She likely will not take it.  [id number]  ----- Message -----    From: [facility]    Sent: 11/21/2015   4:18 AM      To: Marlis EdelsonWalidah N Karim, CNM

## 2015-11-24 NOTE — Telephone Encounter (Signed)
Spoke with husband and let him know pt had BV and a RX ea sent to her pharmacy.

## 2015-11-27 ENCOUNTER — Ambulatory Visit (INDEPENDENT_AMBULATORY_CARE_PROVIDER_SITE_OTHER): Payer: Medicaid Other | Admitting: Family

## 2015-11-27 VITALS — BP 107/73 | HR 98 | Wt 169.0 lb

## 2015-11-27 DIAGNOSIS — N760 Acute vaginitis: Secondary | ICD-10-CM

## 2015-11-27 DIAGNOSIS — Z3493 Encounter for supervision of normal pregnancy, unspecified, third trimester: Secondary | ICD-10-CM

## 2015-11-27 DIAGNOSIS — B9689 Other specified bacterial agents as the cause of diseases classified elsewhere: Secondary | ICD-10-CM

## 2015-11-27 DIAGNOSIS — Z3483 Encounter for supervision of other normal pregnancy, third trimester: Secondary | ICD-10-CM

## 2015-11-27 NOTE — Progress Notes (Signed)
Subjective:  Christy PiedraMelissa Koch is a 29 y.o. G3P2002 at 627w4d being seen today for ongoing prenatal care.  She is currently monitored for the following issues for this high-risk pregnancy and has supervision of high-risk pregnancy; History of preterm labor, current pregnancy; and Limited prenatal care in third trimester on her problem list.  Patient reports vaginal irritation.  Contractions: Irregular. Vag. Bleeding: None.  Movement: Present. Denies leaking of fluid.   The following portions of the patient's history were reviewed and updated as appropriate: allergies, current medications, past family history, past medical history, past social history, past surgical history and problem list. Problem list updated.  Objective:   Filed Vitals:   11/27/15 1035  BP: 107/73  Pulse: 98  Weight: 169 lb (76.658 kg)    Fetal Status: Fetal Heart Rate (bpm): 137   Movement: Present     General:  Alert, oriented and cooperative. Patient is in no acute distress.  Skin: Skin is warm and dry. No rash noted.   Cardiovascular: Normal heart rate noted  Respiratory: Normal respiratory effort, no problems with respiration noted  Abdomen: Soft, gravid, appropriate for gestational age. Pain/Pressure: Present     Pelvic: Vag. Bleeding: None Vag D/C Character: Thin   Cervical exam performed      1/50  Extremities: Normal range of motion.  Edema: None  Mental Status: Normal mood and affect. Normal behavior. Normal judgment and thought content.   Urinalysis: Urine Protein: Negative Urine Glucose: Negative  Assessment and Plan:  Pregnancy: G3P2002 at 157w4d Bacterial Vaginosis  Declines Flagyl for now; will continue home remedies and report status next week.   Term labor symptoms and general obstetric precautions including but not limited to vaginal bleeding, contractions, leaking of fluid and fetal movement were reviewed in detail with the patient. Please refer to After Visit Summary for other counseling  recommendations.  Return in about 1 week (around 12/04/2015).   Eino FarberWalidah Kennith GainN Karim, CNM

## 2015-12-03 ENCOUNTER — Encounter (HOSPITAL_COMMUNITY): Payer: Self-pay | Admitting: *Deleted

## 2015-12-03 ENCOUNTER — Inpatient Hospital Stay (HOSPITAL_COMMUNITY)
Admission: AD | Admit: 2015-12-03 | Discharge: 2015-12-03 | Disposition: A | Discharge status: Home / Self Care | Payer: Medicaid Other | Source: Ambulatory Visit | Attending: Obstetrics & Gynecology | Admitting: Obstetrics & Gynecology

## 2015-12-03 DIAGNOSIS — O218 Other vomiting complicating pregnancy: Secondary | ICD-10-CM

## 2015-12-03 DIAGNOSIS — O471 False labor at or after 37 completed weeks of gestation: Secondary | ICD-10-CM

## 2015-12-03 DIAGNOSIS — Z3A38 38 weeks gestation of pregnancy: Secondary | ICD-10-CM | POA: Diagnosis not present

## 2015-12-03 DIAGNOSIS — O479 False labor, unspecified: Secondary | ICD-10-CM

## 2015-12-03 DIAGNOSIS — O219 Vomiting of pregnancy, unspecified: Secondary | ICD-10-CM | POA: Insufficient documentation

## 2015-12-03 LAB — URINALYSIS, ROUTINE W REFLEX MICROSCOPIC
BILIRUBIN URINE: NEGATIVE
GLUCOSE, UA: NEGATIVE mg/dL
KETONES UR: 15 mg/dL — AB
NITRITE: NEGATIVE
Protein, ur: 30 mg/dL — AB
SPECIFIC GRAVITY, URINE: 1.01 (ref 1.005–1.030)
pH: 7.5 (ref 5.0–8.0)

## 2015-12-03 LAB — URINE MICROSCOPIC-ADD ON: Bacteria, UA: NONE SEEN

## 2015-12-03 MED ORDER — LACTATED RINGERS IV BOLUS (SEPSIS)
1000.0000 mL | Freq: Once | INTRAVENOUS | Status: AC
  Administered 2015-12-03: 1000 mL via INTRAVENOUS

## 2015-12-03 NOTE — MAU Note (Signed)
Pt presents to MAU with complaints of nausea and vomiting since 2 this morning.

## 2015-12-03 NOTE — MAU Provider Note (Signed)
History   [id number]   Chief Complaint  Patient presents with  . Labor Eval  . Emesis During Pregnancy    HPI Christy Koch is a 29 y.o. female  G3P2002 at 7319w3d IUP here with report of contractions that started last night associated with vomiting.  +vomiting with contractions.  +chills, negative fever (98.2).  Recently had a sick child at home.  "Feels dehydrated".  Denies vaginal bleeding or leaking of fluid.  +fetal movement.  Nausea and vomiting has improved.  Patient's last menstrual period was 03/09/2015 (within days).  OB History  Gravida Para Term Preterm AB SAB TAB Ectopic Multiple Living  3 2 2  0 0 0 0 0 0 2    # Outcome Date GA Lbr Len/2nd Weight Sex Delivery Anes PTL Lv  3 Current           2 Term 10/03/13 126w5d  3.487 kg (7 lb 11 oz) F Vag-Spont  Y Y     Comments: System Generated. Please review and update pregnancy details.  1 Term 06/12/12 6457w2d 21:37 / 00:31 3.881 kg (8 lb 8.9 oz) M Vag-Spont Local  Y      History reviewed. No pertinent past medical history.  Family History  Problem Relation Age of Onset  . Varicose Veins Mother   . Cancer Maternal Grandmother   . Diabetes Neg Hx   . Hypertension Neg Hx     Social History   Social History  . Marital Status: Married    Spouse Name: N/A  . Number of Children: N/A  . Years of Education: N/A   Social History Main Topics  . Smoking status: Never Smoker   . Smokeless tobacco: None  . Alcohol Use: No  . Drug Use: No  . Sexual Activity: Yes   Other Topics Concern  . None   Social History Narrative    No Known Allergies  No current facility-administered medications on file prior to encounter.   Current Outpatient Prescriptions on File Prior to Encounter  Medication Sig Dispense Refill  . Prenatal Vit-Fe Fumarate-FA (PRENATAL 19) tablet Chew 1 tablet by mouth daily.    . metroNIDAZOLE (FLAGYL) 500 MG tablet Take 1 tablet (500 mg total) by mouth 2 (two) times daily. (Patient not taking: Reported  on 12/03/2015) 14 tablet 0     Review of Systems  Constitutional: Positive for chills, appetite change and fatigue. Negative for fever.  Gastrointestinal: Positive for nausea and vomiting. Negative for abdominal pain, diarrhea and constipation.  Genitourinary: Positive for pelvic pain ( contractions). Negative for vaginal bleeding.  Neurological: Positive for dizziness.  All other systems reviewed and are negative.    Physical Exam   Filed Vitals:   12/03/15 0813  BP: 102/67  Pulse: 114  Temp: 97.8 F (36.6 C)  Resp: 16    Physical Exam  Constitutional: She is oriented to person, place, and time. She appears well-developed and well-nourished.  HENT:  Head: Normocephalic.  Mouth/Throat: Mucous membranes are dry.  Neck: Normal range of motion. Neck supple.  Cardiovascular: Normal rate, regular rhythm and normal heart sounds.   Respiratory: Effort normal and breath sounds normal. No respiratory distress.  GI: Soft. There is no tenderness.  Genitourinary: No bleeding in the vagina.  Musculoskeletal: Normal range of motion. She exhibits no edema.  Neurological: She is alert and oriented to person, place, and time.  Skin: Skin is warm and dry.    MAU Course  Procedures  IV LR 1000 bolous Pt reports improvement  in symptoms after IV fluid, no longer feeling contractions > desires discharge home  Assessment and Plan  29 y.o. [id number] at [redacted]w[redacted]d IUP  Reactive NST Nausea and Vomiting in Pregnancy  Plan: Discharge home Pregnancy precautions Keep scheduled appt   [name], CNM 12/03/2015 8:56 AM

## 2015-12-03 NOTE — MAU Note (Signed)
Pt C/O uc's since last night, states she vomits almost every time she has a contraction, is unable to hold water down.  Denies bleeding or LOF.

## 2015-12-03 NOTE — Progress Notes (Signed)
Margarita MailW Karim CNM notified of pt's FHR, orders received

## 2015-12-03 NOTE — Discharge Instructions (Signed)
Fetal Movement Counts  Patient Name: __________________________________________________ Patient Due Date: ____________________  Performing a fetal movement count is highly recommended in high-risk pregnancies, but it is good for every pregnant woman to do. Your health care provider may ask you to start counting fetal movements at 28 weeks of the pregnancy. Fetal movements often increase:  · After eating a full meal.  · After physical activity.  · After eating or drinking something sweet or cold.  · At rest.  Pay attention to when you feel the baby is most active. This will help you notice a pattern of your baby's sleep and wake cycles and what factors contribute to an increase in fetal movement. It is important to perform a fetal movement count at the same time each day when your baby is normally most active.   HOW TO COUNT FETAL MOVEMENTS  1. Find a quiet and comfortable area to sit or lie down on your left side. Lying on your left side provides the best blood and oxygen circulation to your baby.  2. Write down the day and time on a sheet of paper or in a journal.  3. Start counting kicks, flutters, swishes, rolls, or jabs in a 2-hour period. You should feel at least 10 movements within 2 hours.  4. If you do not feel 10 movements in 2 hours, wait 2-3 hours and count again. Look for a change in the pattern or not enough counts in 2 hours.  SEEK MEDICAL CARE IF:  · You feel less than 10 counts in 2 hours, tried twice.  · There is no movement in over an hour.  · The pattern is changing or taking longer each day to reach 10 counts in 2 hours.  · You feel the baby is not moving as he or she usually does.  Date: ____________ Movements: ____________ Start time: ____________ Finish time: ____________   Date: ____________ Movements: ____________ Start time: ____________ Finish time: ____________  Date: ____________ Movements: ____________ Start time: ____________ Finish time: ____________  Date: ____________ Movements:  ____________ Start time: ____________ Finish time: ____________  Date: ____________ Movements: ____________ Start time: ____________ Finish time: ____________  Date: ____________ Movements: ____________ Start time: ____________ Finish time: ____________  Date: ____________ Movements: ____________ Start time: ____________ Finish time: ____________  Date: ____________ Movements: ____________ Start time: ____________ Finish time: ____________   Date: ____________ Movements: ____________ Start time: ____________ Finish time: ____________  Date: ____________ Movements: ____________ Start time: ____________ Finish time: ____________  Date: ____________ Movements: ____________ Start time: ____________ Finish time: ____________  Date: ____________ Movements: ____________ Start time: ____________ Finish time: ____________  Date: ____________ Movements: ____________ Start time: ____________ Finish time: ____________  Date: ____________ Movements: ____________ Start time: ____________ Finish time: ____________  Date: ____________ Movements: ____________ Start time: ____________ Finish time: ____________   Date: ____________ Movements: ____________ Start time: ____________ Finish time: ____________  Date: ____________ Movements: ____________ Start time: ____________ Finish time: ____________  Date: ____________ Movements: ____________ Start time: ____________ Finish time: ____________  Date: ____________ Movements: ____________ Start time: ____________ Finish time: ____________  Date: ____________ Movements: ____________ Start time: ____________ Finish time: ____________  Date: ____________ Movements: ____________ Start time: ____________ Finish time: ____________  Date: ____________ Movements: ____________ Start time: ____________ Finish time: ____________   Date: ____________ Movements: ____________ Start time: ____________ Finish time: ____________  Date: ____________ Movements: ____________ Start time: ____________ Finish  time: ____________  Date: ____________ Movements: ____________ Start time: ____________ Finish time: ____________  Date: ____________ Movements: ____________ Start time:   ____________ Finish time: ____________  Date: ____________ Movements: ____________ Start time: ____________ Finish time: ____________  Date: ____________ Movements: ____________ Start time: ____________ Finish time: ____________  Date: ____________ Movements: ____________ Start time: ____________ Finish time: ____________   Date: ____________ Movements: ____________ Start time: ____________ Finish time: ____________  Date: ____________ Movements: ____________ Start time: ____________ Finish time: ____________  Date: ____________ Movements: ____________ Start time: ____________ Finish time: ____________  Date: ____________ Movements: ____________ Start time: ____________ Finish time: ____________  Date: ____________ Movements: ____________ Start time: ____________ Finish time: ____________  Date: ____________ Movements: ____________ Start time: ____________ Finish time: ____________  Date: ____________ Movements: ____________ Start time: ____________ Finish time: ____________   Date: ____________ Movements: ____________ Start time: ____________ Finish time: ____________  Date: ____________ Movements: ____________ Start time: ____________ Finish time: ____________  Date: ____________ Movements: ____________ Start time: ____________ Finish time: ____________  Date: ____________ Movements: ____________ Start time: ____________ Finish time: ____________  Date: ____________ Movements: ____________ Start time: ____________ Finish time: ____________  Date: ____________ Movements: ____________ Start time: ____________ Finish time: ____________  Date: ____________ Movements: ____________ Start time: ____________ Finish time: ____________   Date: ____________ Movements: ____________ Start time: ____________ Finish time: ____________  Date: ____________  Movements: ____________ Start time: ____________ Finish time: ____________  Date: ____________ Movements: ____________ Start time: ____________ Finish time: ____________  Date: ____________ Movements: ____________ Start time: ____________ Finish time: ____________  Date: ____________ Movements: ____________ Start time: ____________ Finish time: ____________  Date: ____________ Movements: ____________ Start time: ____________ Finish time: ____________  Date: ____________ Movements: ____________ Start time: ____________ Finish time: ____________   Date: ____________ Movements: ____________ Start time: ____________ Finish time: ____________  Date: ____________ Movements: ____________ Start time: ____________ Finish time: ____________  Date: ____________ Movements: ____________ Start time: ____________ Finish time: ____________  Date: ____________ Movements: ____________ Start time: ____________ Finish time: ____________  Date: ____________ Movements: ____________ Start time: ____________ Finish time: ____________  Date: ____________ Movements: ____________ Start time: ____________ Finish time: ____________     This information is not intended to replace advice given to you by your health care provider. Make sure you discuss any questions you have with your health care provider.     Document Released: 12/21/2006 Document Revised: 12/12/2014 Document Reviewed: 09/17/2012  Elsevier Interactive Patient Education ©2016 [employer]

## 2015-12-06 NOTE — L&D Delivery Note (Cosign Needed)
Delivery Note At 10:26 PM a viable female was delivered via Vaginal, Spontaneous Delivery (Presentation:  Occiput Anterior).  APGAR:8 ,9 ; weight  .   Placenta status: Intact, Spontaneous.  Cord: 3 vessels with the following complications: None.  Cord pH: n/a  Anesthesia: None  Episiotomy: None Lacerations: None Suture Repair: n/a Est. Blood Loss (mL): 300  Mom to postpartum.  Baby to Couplet care / Skin to Skin.  Christy Koch, MARIE DARLENE 12/13/2015, 10:49 PM

## 2015-12-07 ENCOUNTER — Encounter: Payer: Medicaid Other | Admitting: Family

## 2015-12-11 ENCOUNTER — Encounter: Payer: Medicaid Other | Admitting: Advanced Practice Midwife

## 2015-12-13 ENCOUNTER — Inpatient Hospital Stay (HOSPITAL_COMMUNITY)
Admission: AD | Admit: 2015-12-13 | Discharge: 2015-12-15 | DRG: 775 | Disposition: A | Discharge status: Home / Self Care | Payer: Medicaid Other | Source: Ambulatory Visit | Attending: Obstetrics and Gynecology | Admitting: Obstetrics and Gynecology

## 2015-12-13 ENCOUNTER — Encounter (HOSPITAL_COMMUNITY): Payer: Self-pay

## 2015-12-13 DIAGNOSIS — O4292 Full-term premature rupture of membranes, unspecified as to length of time between rupture and onset of labor: Secondary | ICD-10-CM | POA: Diagnosis not present

## 2015-12-13 DIAGNOSIS — Z3A39 39 weeks gestation of pregnancy: Secondary | ICD-10-CM | POA: Diagnosis not present

## 2015-12-13 DIAGNOSIS — O99824 Streptococcus B carrier state complicating childbirth: Principal | ICD-10-CM | POA: Diagnosis present

## 2015-12-13 DIAGNOSIS — Z809 Family history of malignant neoplasm, unspecified: Secondary | ICD-10-CM

## 2015-12-13 DIAGNOSIS — IMO0001 Reserved for inherently not codable concepts without codable children: Secondary | ICD-10-CM

## 2015-12-13 LAB — CBC
HEMATOCRIT: 35.6 % — AB (ref 36.0–46.0)
HEMOGLOBIN: 11.8 g/dL — AB (ref 12.0–15.0)
MCH: 29.1 pg (ref 26.0–34.0)
MCHC: 33.1 g/dL (ref 30.0–36.0)
MCV: 87.9 fL (ref 78.0–100.0)
PLATELETS: 246 10*3/uL (ref 150–400)
RBC: 4.05 MIL/uL (ref 3.87–5.11)
RDW: 14.4 % (ref 11.5–15.5)
WBC: 15.6 10*3/uL — ABNORMAL HIGH (ref 4.0–10.5)

## 2015-12-13 MED ORDER — OXYCODONE-ACETAMINOPHEN 5-325 MG PO TABS: 1.0000 | ORAL_TABLET | ORAL | Status: DC | PRN

## 2015-12-13 MED ORDER — LACTATED RINGERS IV SOLN: 500.0000 mL | INTRAVENOUS | Status: DC | PRN

## 2015-12-13 MED ORDER — FLEET ENEMA 7-19 GM/118ML RE ENEM: 1.0000 | ENEMA | RECTAL | Status: DC | PRN

## 2015-12-13 MED ORDER — LACTATED RINGERS IV SOLN: INTRAVENOUS | Status: DC

## 2015-12-13 MED ORDER — CITRIC ACID-SODIUM CITRATE 334-500 MG/5ML PO SOLN: 30.0000 mL | ORAL | Status: DC | PRN

## 2015-12-13 MED ORDER — OXYTOCIN 10 UNIT/ML IJ SOLN: 2.5000 [IU]/h | INTRAMUSCULAR | Status: DC

## 2015-12-13 MED ORDER — LIDOCAINE HCL (PF) 1 % IJ SOLN: 30.0000 mL | INTRAMUSCULAR | Status: DC | PRN

## 2015-12-13 MED ORDER — OXYTOCIN BOLUS FROM INFUSION: 500.0000 mL | INTRAVENOUS | Status: DC

## 2015-12-13 MED ORDER — OXYCODONE-ACETAMINOPHEN 5-325 MG PO TABS: 2.0000 | ORAL_TABLET | ORAL | Status: DC | PRN

## 2015-12-13 MED ORDER — ACETAMINOPHEN 325 MG PO TABS: 650.0000 mg | ORAL_TABLET | ORAL | Status: DC | PRN

## 2015-12-13 MED ORDER — ONDANSETRON HCL 4 MG/2ML IJ SOLN: 4.0000 mg | Freq: Four times a day (QID) | INTRAMUSCULAR | Status: DC | PRN

## 2015-12-13 MED ORDER — SODIUM CHLORIDE 0.9 % IV SOLN
2.0000 g | Freq: Once | INTRAVENOUS | Status: DC
  Filled 2015-12-13: qty 2000

## 2015-12-13 NOTE — H&P (Signed)
Christy Koch is a 30 y.o. female presenting for active labor and SROM. Pt refuses IV antibiotics for pos GBS status. Risk reviewed with pt History OB History    Gravida Para Term Preterm AB TAB SAB Ectopic Multiple Living   3 2 2  0 0 0 0 0 0 2     No past medical history on file. Past Surgical History  Procedure Laterality Date  . Wisdom tooth extraction     Family History: family history includes Cancer in her maternal grandmother; Varicose Veins in her mother. There is no history of Diabetes or Hypertension. Social History:  reports that she has never smoked. She does not have any smokeless tobacco history on file. She reports that she does not drink alcohol or use illicit drugs.   Prenatal Transfer Tool  Maternal Diabetes: No Genetic Screening: Normal Maternal Ultrasounds/Referrals: Normal Fetal Ultrasounds or other Referrals:  None Maternal Substance Abuse:  No Significant Maternal Medications:  None Significant Maternal Lab Results:  Lab values include: Group B Strep positive, Other:  Other Comments:  pt refuses iv antibiotics for POS GBS status  Review of Systems  Constitutional: Negative.   HENT: Negative.   Eyes: Negative.   Respiratory: Negative.   Cardiovascular: Negative.   Gastrointestinal: Positive for abdominal pain.  Genitourinary: Negative.   Musculoskeletal: Negative.   Skin: Negative.   Neurological: Negative.   Endo/Heme/Allergies: Negative.   Psychiatric/Behavioral: Negative.     Dilation: 6.5 Effacement (%): 80 Station: 0 Exam by:: K.WIlson,RN Last menstrual period 03/09/2015, unknown if currently breastfeeding. Maternal Exam:  Uterine Assessment: Contraction strength is moderate.  Contraction frequency is regular.   Abdomen: Patient reports no abdominal tenderness. Fetal presentation: vertex  Introitus: Normal vulva. Normal vagina.  Amniotic fluid character: clear.  Pelvis: adequate for delivery.   Cervix: Cervix evaluated by digital  exam.     Fetal Exam Fetal Monitor Review: Mode: ultrasound.   Variability: moderate (6-25 bpm).   Pt refuses EFM     Physical Exam  Constitutional: She is oriented to person, place, and time. She appears well-developed and well-nourished.  HENT:  Head: Normocephalic.  Eyes: Pupils are equal, round, and reactive to light.  Neck: Normal range of motion.  Cardiovascular: Normal rate, regular rhythm, normal heart sounds and intact distal pulses.   Respiratory: Effort normal and breath sounds normal.  GI: Soft. Bowel sounds are normal.  Musculoskeletal: Normal range of motion.  Neurological: She is alert and oriented to person, place, and time. She has normal reflexes.  Skin: Skin is warm and dry.  Psychiatric: She has a normal mood and affect. Her behavior is normal. Judgment and thought content normal.    Prenatal labs: ABO, Rh: B/POS/-- (09/19 1203) Antibody: NEG (09/19 1203) Rubella: 3.96 (09/19 1203) RPR: NON REAC (09/19 1203)  HBsAg: NEGATIVE (09/19 1203)  HIV: NONREACTIVE (09/19 1203)  GBS: Positive (12/16 0000)   Assessment/Plan: Admit. Anticipate vag delivery   Wyvonnia DuskyLAWSON, Christy DARLENE 12/13/2015, 9:29 PM

## 2015-12-13 NOTE — MAU Note (Signed)
Pt reports he r water broke earlier this afternoon ctx  Strong  Now.

## 2015-12-14 ENCOUNTER — Encounter (HOSPITAL_COMMUNITY): Payer: Self-pay

## 2015-12-14 LAB — ABO/RH: ABO/RH(D): B POS

## 2015-12-14 LAB — TYPE AND SCREEN
ABO/RH(D): B POS
Antibody Screen: NEGATIVE

## 2015-12-14 LAB — RPR: RPR: NONREACTIVE

## 2015-12-14 MED ORDER — VITAMIN K1 1 MG/0.5ML IJ SOLN
INTRAMUSCULAR | Status: AC
  Filled 2015-12-14: qty 0.5

## 2015-12-14 MED ORDER — SODIUM CHLORIDE 0.9 % IJ SOLN: 3.0000 mL | Freq: Two times a day (BID) | INTRAMUSCULAR | Status: DC

## 2015-12-14 MED ORDER — SIMETHICONE 80 MG PO CHEW: 80.0000 mg | CHEWABLE_TABLET | ORAL | Status: DC | PRN

## 2015-12-14 MED ORDER — BENZOCAINE-MENTHOL 20-0.5 % EX AERO: 1.0000 "application " | INHALATION_SPRAY | CUTANEOUS | Status: DC | PRN

## 2015-12-14 MED ORDER — ONDANSETRON HCL 4 MG PO TABS: 4.0000 mg | ORAL_TABLET | ORAL | Status: DC | PRN

## 2015-12-14 MED ORDER — ACETAMINOPHEN 325 MG PO TABS: 650.0000 mg | ORAL_TABLET | ORAL | Status: DC | PRN

## 2015-12-14 MED ORDER — SENNOSIDES-DOCUSATE SODIUM 8.6-50 MG PO TABS: 2.0000 | ORAL_TABLET | ORAL | Status: DC

## 2015-12-14 MED ORDER — SODIUM CHLORIDE 0.9 % IV SOLN: 250.0000 mL | INTRAVENOUS | Status: DC | PRN

## 2015-12-14 MED ORDER — TETANUS-DIPHTH-ACELL PERTUSSIS 5-2.5-18.5 LF-MCG/0.5 IM SUSP: 0.5000 mL | Freq: Once | INTRAMUSCULAR | Status: DC

## 2015-12-14 MED ORDER — WITCH HAZEL-GLYCERIN EX PADS: 1.0000 "application " | MEDICATED_PAD | CUTANEOUS | Status: DC | PRN

## 2015-12-14 MED ORDER — DIPHENHYDRAMINE HCL 25 MG PO CAPS: 25.0000 mg | ORAL_CAPSULE | Freq: Four times a day (QID) | ORAL | Status: DC | PRN

## 2015-12-14 MED ORDER — IBUPROFEN 600 MG PO TABS
600.0000 mg | ORAL_TABLET | Freq: Four times a day (QID) | ORAL | Status: DC
  Filled 2015-12-14: qty 1

## 2015-12-14 MED ORDER — MEASLES, MUMPS & RUBELLA VAC ~~LOC~~ INJ
0.5000 mL | INJECTION | Freq: Once | SUBCUTANEOUS | Status: DC
  Filled 2015-12-14: qty 0.5

## 2015-12-14 MED ORDER — LANOLIN HYDROUS EX OINT: TOPICAL_OINTMENT | CUTANEOUS | Status: DC | PRN

## 2015-12-14 MED ORDER — ONDANSETRON 4 MG PO TBDP
4.0000 mg | ORAL_TABLET | Freq: Three times a day (TID) | ORAL | Status: DC | PRN
  Filled 2015-12-14 (×2): qty 1

## 2015-12-14 MED ORDER — PRENATAL MULTIVITAMIN CH: 1.0000 | ORAL_TABLET | Freq: Every day | ORAL | Status: DC

## 2015-12-14 MED ORDER — ZOLPIDEM TARTRATE 5 MG PO TABS: 5.0000 mg | ORAL_TABLET | Freq: Every evening | ORAL | Status: DC | PRN

## 2015-12-14 MED ORDER — SODIUM CHLORIDE 0.9 % IJ SOLN: 3.0000 mL | INTRAMUSCULAR | Status: DC | PRN

## 2015-12-14 MED ORDER — DIBUCAINE 1 % RE OINT: 1.0000 "application " | TOPICAL_OINTMENT | RECTAL | Status: DC | PRN

## 2015-12-14 MED ORDER — ONDANSETRON HCL 4 MG/2ML IJ SOLN: 4.0000 mg | INTRAMUSCULAR | Status: DC | PRN

## 2015-12-14 NOTE — Progress Notes (Signed)
Post Partum Day 1  Subjective:  Christy Koch is a 30 y.o. [id number] 3066w6d s/p SVD.  No acute events overnight.  Pt denies problems with ambulating, voiding or po intake.  She denies nausea or vomiting.  Pain is moderately controlled.  She has not had flatus. She has not had bowel movement.  Lochia Small.  Plan for birth control is natural family planning (NFP).  Method of Feeding: breast   Objective: BP 107/59 mmHg  Pulse 79  Temp(Src) 98.1 F (36.7 C) (Oral)  Resp 18  Ht 5\' 7"  (1.702 m)  Wt 74.844 kg (165 lb)  BMI 25.84 kg/m2  LMP 03/09/2015 (Within Days)  Breastfeeding? Unknown  Physical Exam:  General: alert, cooperative and no distress Chest: CTAB Heart: RRR no m/r/g Abdomen: +BS, soft, nontender, fundus firm at/below umbilicus Uterine Fundus: firm, below umbilicus DVT Evaluation: No evidence of DVT seen on physical exam. Extremities: no edema   Recent Labs  12/13/15 2305  HGB 11.8*  HCT 35.6*    Assessment/Plan:  ASSESSMENT: Christy PiedraMelissa Varble is a 30 y.o. [id number] 1366w6d ppd #1 s/p NSVD doing well.   Plan for discharge tomorrow   LOS: 1 day   Palma HolterKanishka G Gunadasa 12/14/2015, 7:49 AM

## 2015-12-14 NOTE — Lactation Note (Signed)
This note was copied from the chart of Christy Koch. Lactation Consultation Note Experienced BF mom has 31 yr old that still BF for comfort at night. Mom has good everted nipples. Baby latches well, occasionally hears clicking sound. Adjusted chin to wider flange. Mom denies painful latches. Mom doing STS. Discussed newborn behavior and BF positioning different from older children BF. Encouraged to look for cues, mom particular about things not wanting antibiotics and any vaccinations for baby. Mom had a blood loss of 625ml, feels weak. Encouraged rest. Referred to Baby and Me Book in Breastfeeding section Pg. 22-23 for position options and Proper latch demonstration.[facility]/LC brochure given w/resources, support groups and LC services. Patient Name: Christy Koch Today's Date: 12/14/2015 Reason for consult: Initial assessment   Maternal Data Has patient been taught Hand Expression?: Yes Does the patient have breastfeeding experience prior to this delivery?: Yes  Feeding Feeding Type: Breast Fed Length of feed: 10 min (still BF when left)  LATCH Score/Interventions Latch: Repeated attempts needed to sustain latch, nipple held in mouth throughout feeding, stimulation needed to elicit sucking reflex. Intervention(s): Assist with latch  Audible Swallowing: Spontaneous and intermittent Intervention(s): Skin to skin;Hand expression;Alternate breast massage  Type of Nipple: Everted at rest and after stimulation  Comfort (Breast/Nipple): Soft / non-tender     Hold (Positioning): Assistance needed to correctly position infant at breast and maintain latch. Intervention(s): Breastfeeding basics reviewed;Support Pillows;Position options;Skin to skin  LATCH Score: 8  Lactation Tools Discussed/Used WIC Program: Yes   Consult Status Consult Status: Follow-up Date: 12/15/15 Follow-up type: In-patient    Charyl DancerCARVER, Nerea Bordenave G 12/14/2015, 6:44 AM

## 2015-12-14 NOTE — Progress Notes (Signed)
Pt refused IV placement and antibiotic treatment for Group Beta Strep. Pt was made aware of risks by Marlynn Perking. Lawson, CNM. Pt also refused erythromycin ointment for newborn's eyes, waiver signed and in chart. Pt refused continuous monitoring during labor and delivery.

## 2015-12-14 NOTE — Lactation Note (Signed)
This note was copied from the chart of Christy Koch. Lactation Consultation Note  Patient Name: Christy Koch [id number]'XToday's Date: 12/14/2015 Reason for consult: Follow-up assessment   With this experienced breast feeding mom and term baby, now 4816 hours old. Mom states baby slept for about 4-5 hours this morning, and has been cluster feeding since 11 am. I had mom [positioin herself and baby with pillows, and showed her how to obtain a deep latch with cross cralel hold, and then switch to cradle, if she prefers. Mom reports this latch deeper and more comfortable, and was able to latch in cross cradle independently, before the end of the consult. Mom to call for questions/concerns.    Maternal Data    Feeding Feeding Type: Breast Fed Length of feed: 10 min (cluster feeding)  LATCH Score/Interventions Latch: Grasps breast easily, tongue down, lips flanged, rhythmical sucking.  Audible Swallowing: Spontaneous and intermittent  Type of Nipple: Everted at rest and after stimulation  Comfort (Breast/Nipple): Soft / non-tender     Hold (Positioning): No assistance needed to correctly position infant at breast. Intervention(s): Breastfeeding basics reviewed;Support Pillows;Position options;Skin to skin  LATCH Score: 10  Lactation Tools Discussed/Used     Consult Status Consult Status: PRN Follow-up type: Call as needed    Christy Koch, Christy Koch 12/14/2015, 2:30 PM

## 2015-12-15 MED ORDER — SENNOSIDES-DOCUSATE SODIUM 8.6-50 MG PO TABS: 1.0000 | ORAL_TABLET | Freq: Every day | ORAL | Status: DC

## 2015-12-15 MED ORDER — ACETAMINOPHEN 325 MG PO TABS: 650.0000 mg | ORAL_TABLET | ORAL | Status: AC | PRN

## 2015-12-15 MED ORDER — IBUPROFEN 600 MG PO TABS: 600.0000 mg | ORAL_TABLET | Freq: Four times a day (QID) | ORAL | Status: DC | PRN

## 2015-12-15 NOTE — Discharge Summary (Signed)
OB Discharge Summary     Patient Name: Christy Koch DOB: 07/30/1986 MRN: [id number]  Date of admission: 12/13/2015 Delivering MD: Wyvonnia Dusky D   Date of discharge: 12/15/2015  Admitting diagnosis: [place] Broke Intrauterine pregnancy: [redacted]w[redacted]d     Secondary diagnosis:  Active Problems:   Active labor  Additional problems: none     Discharge diagnosis: Term Pregnancy Delivered                                                                                                Post partum procedures:none  Augmentation: none  Complications: None  Hospital course:  Onset of Labor With Vaginal Delivery     30 y.o. yo [id number] at [redacted]w[redacted]d was admitted in Active Labor on 12/13/2015. Patient had an uncomplicated labor course as follows:  Membrane Rupture Time/Date: 6:30 PM ,12/13/2015   Intrapartum Procedures: Episiotomy: None [1]                                         Lacerations:  None [1]  Patient had a delivery of a Viable infant. 12/13/2015  Information for the patient's newborn:  Kailynne, [place] Girl Priseis [[id number]]  Delivery Method: Vaginal, Spontaneous Delivery (Filed from Delivery Summary)    Pateint had an uncomplicated postpartum course.  She is ambulating, tolerating a regular diet, passing flatus, and urinating well. Patient is discharged home in stable condition on 12/15/2015.    Physical exam  Filed Vitals:   12/14/15 0600 12/14/15 1420 12/14/15 1811 12/15/15 0500  BP: 107/59 101/67 101/75 113/73  Pulse: 79 93 83 82  Temp: 98.1 F (36.7 C) 99 F (37.2 C) 98.3 F (36.8 C) 98.4 F (36.9 C)  TempSrc: Oral Oral Oral Oral  Resp: 18 18 18 18   Height:      Weight:       General: alert, cooperative and no distress Lochia: small, no clots noted on pad Uterine Fundus: firm, below umbilicus Incision: N/A DVT Evaluation: No evidence of DVT seen on physical exam. Negative Homan's sign. No cords or calf tenderness. No significant calf/ankle edema. Labs: Lab Results  Component  Value Date   WBC 15.6* 12/13/2015   HGB 11.8* 12/13/2015   HCT 35.6* 12/13/2015   MCV 87.9 12/13/2015   PLT 246 12/13/2015   CMP Latest Ref Rng 05/11/2012  Glucose 70 - 99 mg/dL 75    Discharge instruction: per After Visit Summary and "Baby and Me Booklet".  After visit meds:    Medication List    ASK your doctor about these medications        PRENATAL 19 tablet  Chew 1 tablet by mouth daily.        Diet: routine diet  Activity: Advance as tolerated. Pelvic rest for 6 weeks.   Outpatient follow up:6 weeks Follow up Appt:No future appointments. Follow up Visit:No Follow-up on file.  Postpartum contraception: Natural Family Planning  Newborn Data: Live born female  Birth Weight: 8 lb 2.2 oz (3690 g) APGAR: 8, 9  Baby Feeding: Breast Disposition:home with mother   12/15/2015 Palma HolterKanishka G Gunadasa, MD   APP attestation:  I have seen and examined this patient; I agree with above documentation in the Resident's note.   Christy Koch is a 30 y.o. [id number]   PE: BP 113/73 mmHg  Pulse 82  Temp(Src) 98.4 F (36.9 C) (Oral)  Resp 18  Ht 5\' 7"  (1.702 m)  Wt 74.844 kg (165 lb)  BMI 25.84 kg/m2  LMP 03/09/2015 (Within Days)  Breastfeeding? Unknown Gen: calm comfortable, NAD Resp: normal effort, no distress Abd: gravid  ROS, labs, PMH reviewed  Plan: Discharge Christy Koch 12/25/2015, 7:51 PM

## 2015-12-15 NOTE — Lactation Note (Signed)
This note was copied from the chart of Christy Koch. Lactation Consultation Note  Patient Name: Christy Terri PiedraMelissa Murchison OZHYQ'MToday's Date: 12/15/2015 Reason for consult: Follow-up assessment   Follow up with mom prior to d/c. Infant with 12 Bf for 10-70 minutes, 4 voids and 4 stools in last 24 hours. Weight 7lb 10.9 oz with 6% weight loss since birth. LATCH scores 9-10 by bedside RN. Mom reports infant is BF well. She reports infant has been cluster feeding and is now resting. Mom reports she feels like BF is going well. She denies nipple pain. Mom with questions in regards to tandem nursing. Enc to always feed NB first 8-12 x in 24 hours at first feeding cues and offer toddler after NB has fed. Engorgement prevention reviewed with mom. Reviewed all BF information in Taking Care of Baby and Me Booklet. Reviewed LC Brochure, mom aware of OP Services, BF Support Groups and LC phone #. Mom has Ped in [place] and plans to call and make appt for the next 2 days for infant f/u. Enc mom to call with questions/concerns prn.   Maternal Data Formula Feeding for Exclusion: No Does the patient have breastfeeding experience prior to this delivery?: Yes  Feeding    LATCH Score/Interventions                      Lactation Tools Discussed/Used Pump Review: Milk Storage   Consult Status Consult Status: Complete Date: 12/15/15 Follow-up type: Call as needed    Silas FloodSharon S Yani Lal 12/15/2015, 10:07 AM

## 2015-12-15 NOTE — Discharge Instructions (Signed)

## 2015-12-19 NOTE — Discharge Summary (Signed)
OB Discharge Summary     Patient Name: Christy Koch DOB: 30-Aug-1986 MRN: [id number]  Date of admission: 12/13/2015 Delivering MD: Wyvonnia DuskyLAWSON, MARIE D   Date of discharge: 12/19/2015  Admitting diagnosis: [place] Intrauterine pregnancy: 6653w6d     Secondary diagnosis:  Active Problems:   Active labor  Additional problems: none     Discharge diagnosis: Term Pregnancy Delivered                                                                                                Post partum procedures:none  Augmentation: none  Complications: None  Hospital course:  Onset of Labor With Vaginal Delivery     30 y.o. yo [id number] at 5153w6d was admitted in Active Labor on 12/13/2015. Patient had an uncomplicated labor course as follows:  Membrane Rupture Time/Date: 6:30 PM ,12/13/2015   Intrapartum Procedures: Episiotomy: None [1]                                         Lacerations:  None [1]  Patient had a delivery of a Viable infant. 12/13/2015  Information for the patient's newborn:  Wallis Bambergashland, Catherine Marie [[id number]][[id number]]  Delivery Method: Vaginal, Spontaneous Delivery (Filed from Delivery Summary)     Pateint had an uncomplicated postpartum course.  She is ambulating, tolerating a regular diet, passing flatus, and urinating well. Patient is discharged home in stable condition on 12/19/2015.    Physical exam  Filed Vitals:   12/14/15 0600 12/14/15 1420 12/14/15 1811 12/15/15 0500  BP: 107/59 101/67 101/75 113/73  Pulse: 79 93 83 82  Temp: 98.1 F (36.7 C) 99 F (37.2 C) 98.3 F (36.8 C) 98.4 F (36.9 C)  TempSrc: Oral Oral Oral Oral  Resp: 18 18 18 18   Height:      Weight:       General: alert, cooperative and no distress Lochia: small, no clots noted on pad Uterine Fundus: firm, below umbilicus Incision: N/A DVT Evaluation: No evidence of DVT seen on physical exam. Negative Homan's sign. No cords or calf tenderness. No significant calf/ankle edema. Labs: Lab Results   Component Value Date   WBC 15.6* 12/13/2015   HGB 11.8* 12/13/2015   HCT 35.6* 12/13/2015   MCV 87.9 12/13/2015   PLT 246 12/13/2015   CMP Latest Ref Rng 05/11/2012  Glucose 70 - 99 mg/dL 75    Discharge instruction: per After Visit Summary and "Baby and Me Booklet".  After visit meds:    Medication List    TAKE these medications        acetaminophen 325 MG tablet  Commonly known as:  TYLENOL  Take 2 tablets (650 mg total) by mouth every 4 (four) hours as needed (for pain scale < 4).     ibuprofen 600 MG tablet  Commonly known as:  ADVIL,MOTRIN  Take 1 tablet (600 mg total) by mouth every 6 (six) hours as needed.     PRENATAL 19 tablet  Chew 1 tablet by mouth daily.  senna-docusate 8.6-50 MG tablet  Commonly known as:  Senokot-S  Take 1 tablet by mouth at bedtime.        Diet: routine diet  Activity: Advance as tolerated. Pelvic rest for 6 weeks.   Outpatient follow up:6 weeks Follow up Appt: Future Appointments Date Time Provider Department Center  01/29/2016 10:30 AM [name], CNM [facility]   Follow up Visit:No Follow-up on file.  Postpartum contraception: Natural Family Planning  Newborn Data: Live born female  Birth Weight: 8 lb 2.2 oz (3690 g) APGAR: 8, 9  Baby Feeding: Breast Disposition:home with mother   12/19/2015 Leeroy Cha, CNM

## 2016-01-11 ENCOUNTER — Ambulatory Visit: Payer: Medicaid Other | Admitting: Obstetrics & Gynecology

## 2016-01-29 ENCOUNTER — Ambulatory Visit (INDEPENDENT_AMBULATORY_CARE_PROVIDER_SITE_OTHER): Payer: Medicaid Other | Admitting: Family

## 2016-01-29 NOTE — Progress Notes (Signed)
Patient ID: Christy Koch, female   DOB: January 18, 1986, 30 y.o.   MRN: [id number] Post Partum Exam  Christy Koch is a 30 y.o. G48P3003 female who presents for a postpartum visit. She is 6 weeks postpartum following a spontaneous vaginal delivery. I have fully reviewed the prenatal and intrapartum course. The delivery was at [redacted]w[redacted]d gestational weeks.  Anesthesia: none. Postpartum course has been unremarkable. Baby's course has been unremarkable. Baby is feeding by breast. Bleeding no bleeding. Bowel function is normal. Bladder function is normal. Patient is not sexually active. Contraception method is rhythm method. Postpartum depression screening: negative - Score=6.  The following portions of the patient's history were reviewed and updated as appropriate: allergies, current medications, past family history, past medical history, past social history, past surgical history and problem list.  Review of Systems Pertinent items are noted in HPI.   Objective:    BP 116/78 mmHg  Pulse 78  Resp 16  Ht  (1.651 m)  Wt 211 lb (95.709 kg)  BMI 35.11 kg/m2  Breastfeeding? Yes   General:  alert, cooperative and appears stated age   Breasts:  inspection negative, no nipple discharge or bleeding, no masses or nodularity palpable  Lungs: clear to auscultation bilaterally  Heart:  regular rate and rhythm, S1, S2 normal, no murmur, click, rub or gallop  Abdomen: soft, non-tender; bowel sounds normal; no masses,  no organomegaly  Pelvic exam not indicated - not bleeding, no pain at vaginal area Assessment:    Normal postpartum exam. Pap smear not done at today's visit.   Plan:   1. Contraception: rhythm method 2. Follow up as needed.   Eino Farber Kennith Gain, CNM

## 2017-05-15 ENCOUNTER — Encounter: Payer: Medicaid Other | Admitting: Obstetrics & Gynecology

## 2017-05-15 ENCOUNTER — Encounter: Payer: Self-pay | Admitting: Obstetrics & Gynecology

## 2017-05-16 NOTE — Progress Notes (Signed)
This encounter was created in error - please disregard.

## 2017-06-16 ENCOUNTER — Encounter: Payer: Medicaid Other | Admitting: Advanced Practice Midwife

## 2017-07-07 ENCOUNTER — Encounter (INDEPENDENT_AMBULATORY_CARE_PROVIDER_SITE_OTHER): Payer: Self-pay

## 2017-07-07 ENCOUNTER — Ambulatory Visit (INDEPENDENT_AMBULATORY_CARE_PROVIDER_SITE_OTHER): Payer: Medicaid Other | Admitting: Advanced Practice Midwife

## 2017-07-07 ENCOUNTER — Encounter: Payer: Self-pay | Admitting: Advanced Practice Midwife

## 2017-07-07 ENCOUNTER — Other Ambulatory Visit (HOSPITAL_COMMUNITY)
Admission: RE | Admit: 2017-07-07 | Discharge: 2017-07-07 | Disposition: A | Discharge status: Home / Self Care | Payer: Medicaid Other | Source: Ambulatory Visit | Attending: Obstetrics & Gynecology | Admitting: Obstetrics & Gynecology

## 2017-07-07 VITALS — BP 101/70 | HR 85 | Wt 161.0 lb

## 2017-07-07 DIAGNOSIS — O0930 Supervision of pregnancy with insufficient antenatal care, unspecified trimester: Secondary | ICD-10-CM | POA: Diagnosis present

## 2017-07-07 DIAGNOSIS — O09899 Supervision of other high risk pregnancies, unspecified trimester: Secondary | ICD-10-CM | POA: Insufficient documentation

## 2017-07-07 DIAGNOSIS — O09212 Supervision of pregnancy with history of pre-term labor, second trimester: Secondary | ICD-10-CM

## 2017-07-07 DIAGNOSIS — O09219 Supervision of pregnancy with history of pre-term labor, unspecified trimester: Secondary | ICD-10-CM

## 2017-07-07 DIAGNOSIS — Z113 Encounter for screening for infections with a predominantly sexual mode of transmission: Secondary | ICD-10-CM | POA: Diagnosis not present

## 2017-07-07 DIAGNOSIS — O0932 Supervision of pregnancy with insufficient antenatal care, second trimester: Secondary | ICD-10-CM

## 2017-07-07 DIAGNOSIS — O0933 Supervision of pregnancy with insufficient antenatal care, third trimester: Secondary | ICD-10-CM

## 2017-07-07 DIAGNOSIS — O0992 Supervision of high risk pregnancy, unspecified, second trimester: Secondary | ICD-10-CM

## 2017-07-07 NOTE — Progress Notes (Signed)
  Subjective:    Christy Koch is a [id number] 3636w3d being seen today for her first obstetrical visit.  Her obstetrical history is significant for late onset of care, low risk pregnancy, 3 prior vaginal births. Patient does intend to breast feed. Pregnancy history fully reviewed.  Patient reports no complaints.  Vitals:   07/07/17 0943  BP: 101/70  Pulse: 85  Weight: 161 lb (73 kg)    HISTORY: OB History  Gravida Para Term Preterm AB Living  4 3 3  0 0 3  SAB TAB Ectopic Multiple Live Births  0 0 0 0 3    # Outcome Date GA Lbr Len/2nd Weight Sex Delivery Anes PTL Lv  4 Current           3 Term 12/13/15 778w6d 04:53 / 00:03 8 lb 2.2 oz (3.69 kg) F Vag-Spont None  LIV  2 Term 10/03/13 1635w5d  7 lb 11 oz (3.487 kg) F Vag-Spont  Y LIV     Birth Comments: System Generated. Please review and update pregnancy details.  1 Term 06/12/12 3341w2d 21:37 / 00:31 8 lb 8.9 oz (3.881 kg) M Vag-Spont Local  LIV     History reviewed. No pertinent past medical history. Past Surgical History:  Procedure Laterality Date  . WISDOM TOOTH EXTRACTION     Family History  Problem Relation Age of Onset  . Varicose Veins Mother   . Cancer Maternal Grandmother   . Diabetes Neg Hx   . Hypertension Neg Hx      Exam    Uterus:  Fundal Height: 23 cm  Pelvic Exam:    Perineum: Normal Perineum   Vulva: Bartholin's, Urethra, Skene's normal   Vagina:  normal mucosa, normal discharge   pH:    Cervix: no cervical motion tenderness   Adnexa: no mass, fullness, tenderness   Bony Pelvis: gynecoid  System: Breast:  normal appearance, no masses or tenderness   Skin: normal coloration and turgor, no rashes, Varicosities on legs bilaterally, no signs of DVT    Neurologic: oriented, grossly non-focal   Extremities: normal strength, tone, and muscle mass   HEENT neck supple with midline trachea   Mouth/Teeth mucous membranes moist, pharynx normal without lesions   Neck supple   Cardiovascular: regular rate  and rhythm   Respiratory:  appears well, vitals normal, no respiratory distress, acyanotic, normal RR, ear and throat exam is normal, neck free of mass or lymphadenopathy, chest clear, no wheezing, crepitations, rhonchi, normal symmetric air entry   Abdomen: soft, non-tender; bowel sounds normal; no masses,  no organomegaly   Urinary: urethral meatus normal      Assessment:    Pregnancy: [id number] Patient Active Problem List   Diagnosis Date Noted  . History of preterm delivery, currently pregnant 07/07/2017  . Late prenatal care affecting pregnancy in second trimester 07/07/2017  . Active labor 12/13/2015        Plan:     Initial labs drawn. Prenatal vitamins. Problem list reviewed and updated. Genetic Screening discussed Quad Screen: declined.  Ultrasound discussed; fetal survey: ordered.  Follow up in 4 weeks. 50% of 30 min visit spent on counseling and coordination of care.  Routines reviewed Welcomed back to practice Glucola next visit Anatomy US ordered   Wynelle BourgeoisMarie Williams 07/07/2017

## 2017-07-07 NOTE — Patient Instructions (Signed)

## 2017-07-09 LAB — CULTURE, URINE COMPREHENSIVE: ORGANISM ID, BACTERIA: NO GROWTH

## 2017-07-10 LAB — PRENATAL PROFILE (SOLSTAS)
Antibody Screen: NEGATIVE
BASOS PCT: 0 %
Basophils Absolute: 0 cells/uL (ref 0–200)
Eosinophils Absolute: 88 cells/uL (ref 15–500)
Eosinophils Relative: 1 %
HEMATOCRIT: 36.9 % (ref 35.0–45.0)
HEP B S AG: NONREACTIVE
HIV 1&2 Ab, 4th Generation: NONREACTIVE
Hemoglobin: 12.2 g/dL (ref 11.7–15.5)
LYMPHS ABS: 1496 {cells}/uL (ref 850–3900)
Lymphocytes Relative: 17 %
MCH: 28.6 pg (ref 27.0–33.0)
MCHC: 33.1 g/dL (ref 32.0–36.0)
MCV: 86.4 fL (ref 80.0–100.0)
MONO ABS: 528 {cells}/uL (ref 200–950)
MPV: 10 fL (ref 7.5–12.5)
Monocytes Relative: 6 %
NEUTROS PCT: 76 %
Neutro Abs: 6688 cells/uL (ref 1500–7800)
Platelets: 245 10*3/uL (ref 140–400)
RBC: 4.27 MIL/uL (ref 3.80–5.10)
RDW: 13.5 % (ref 11.0–15.0)
Rh Type: POSITIVE
Rubella: 3.63 Index — ABNORMAL HIGH (ref <0.90)
WBC: 8.8 10*3/uL (ref 3.8–10.8)

## 2017-07-10 LAB — URINE CYTOLOGY ANCILLARY ONLY
CHLAMYDIA, DNA PROBE: NEGATIVE
Neisseria Gonorrhea: NEGATIVE

## 2017-07-18 ENCOUNTER — Ambulatory Visit (HOSPITAL_COMMUNITY): Payer: Medicaid Other

## 2017-08-04 ENCOUNTER — Ambulatory Visit (HOSPITAL_COMMUNITY): Payer: Medicaid Other

## 2017-08-04 ENCOUNTER — Encounter: Payer: Medicaid Other | Admitting: Advanced Practice Midwife

## 2017-09-01 NOTE — Telephone Encounter (Signed)
Pt states that she has very fast deliveries and is concerned that we are too far away when she goes into labor. Pt told that she can cancel her appt with [place] when she decides if she wants to get care closer to home. Pt will call her area OB's and call us back if she decides to switch care.

## 2017-09-01 NOTE — Telephone Encounter (Signed)
OB, Due at the end of November. Patient has recently moved to [place], she would like to talk with someone because she is further away from our office. She recently moved up this way from [place], and this is her 4th child. She may be moving a little father away from [place]. She is not sure if she should keep her appointment with [place], just to have care and then transfer if needed. She would like to talk with someone about what is best. Please advise.

## 2017-09-05 ENCOUNTER — Encounter: Attending: Advanced Practice Midwife

## 2017-09-28 IMAGING — US US MFM OB COMP +14 WKS
1 series · 14 of 28 positions shown · non-contrast
Comparison: none

OBSTETRICS REPORT
(Signed Final 09/02/2015 [DATE])

Name:       RAD DUMAS                       Visit  09/02/2015 [DATE]
Date:
Service(s) Provided
Indications
Basic anatomic survey                                  Z36
25 weeks gestation of pregnancy
Fetal Evaluation
Num Of             1
Fetuses:
Fetal Heart        146                          bpm
Rate:
Cardiac Activity:  Observed
Presentation:      Breech
Placenta:          Posterior, above cervical
os
P. Cord            Visualized
Insertion:
Amniotic Fluid
AFI FV:      Subjectively within normal limits
Biometry
BPD:       56   m    G. Age:   23w 1d                 CI:        59.61   70 - 86
m
FL/HC:      20.0   18.7 -
20.3
HC:     235.4   m    G. Age:   25w 4d        39  %    HC/AC:      1.18   1.04 -
AC:     200.3   m    G. Age:   24w 4d        23  %    FL/BPD      84.1   71 - 87
m                                     :
FL:      47.1   m    G. Age:   25w 5d        50  %    FL/AC:      23.5   20 - 24
HUM:     43.8   m    G. Age:   26w 0d        64  %
Est.         764   gm   1 lb 11 oz      48   %
FW:
Gestational Age
LMP:           25w 2d        Date:  03/09/15                  EDD:   12/14/15
U/S Today:     24w 5d                                         EDD:   12/18/15
Best:          25w 2d    Det. By:   LMP  (03/09/15)           EDD:   12/14/15
Anatomy
Cranium:          Appears normal         Aortic Arch:       Appears normal
Fetal Cavum:      Appears normal         Ductal Arch:       Not well visualized
Ventricles:       Appears normal         Diaphragm:         Not well visualized
Choroid Plexus:   Appears normal         Stomach:           Appears normal,
left sided
Cerebellum:       Appears normal         Abdomen:           Appears normal
Posterior         Appears normal         Abdominal          Not well visualized
Fossa:                                   Wall:
Nuchal Fold:      Not applicable (>20    Cord Vessels:      Appears normal (3
wks GA)                                   vessel cord)
Face:             Profile appears        Kidneys:           Appear normal
normal; orbits not
well se
Lips:             Appears normal         Bladder:           Not well visualized
Palate:           Not well visualized    Spine:             Appears normal
Heart:            Not well visualized    Lower              Appears normal
Extremities:
RVOT:             Not well visualized    Upper              Appears normal
LVOT:             Not well visualized
Other:   Heels appears normal. Parents do not wish to know sex of fetus.
Cervix Uterus Adnexa
Cervical Length:    3.6       cm
Cervix:       Normal appearance by transabdominal scan.
Appears closed, without funnelling.
Impression
INDICATION: 29 yr old [id number] at 28w2d for fetal anatomic
survey. Remote read.

[Series 1: us mfm ob comp +14 wks · 14 of 85 slices shown]
[im 4/85]
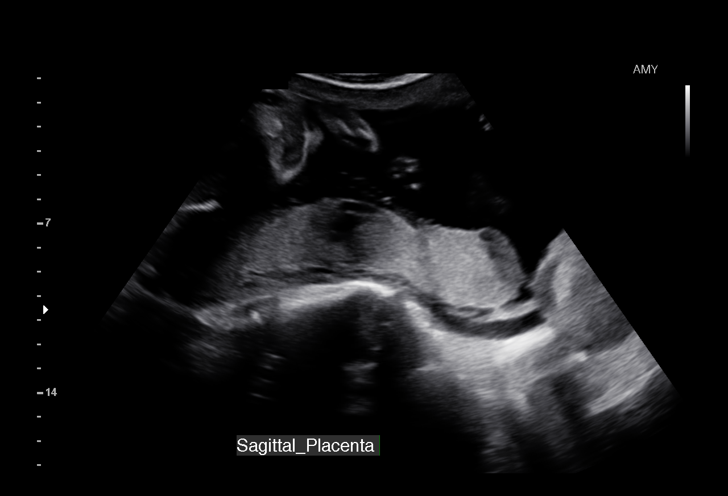
[im 10/85]
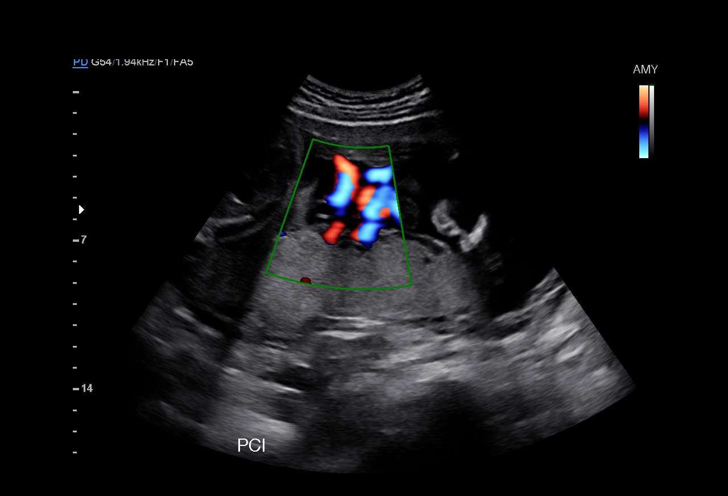
[im 16/85]
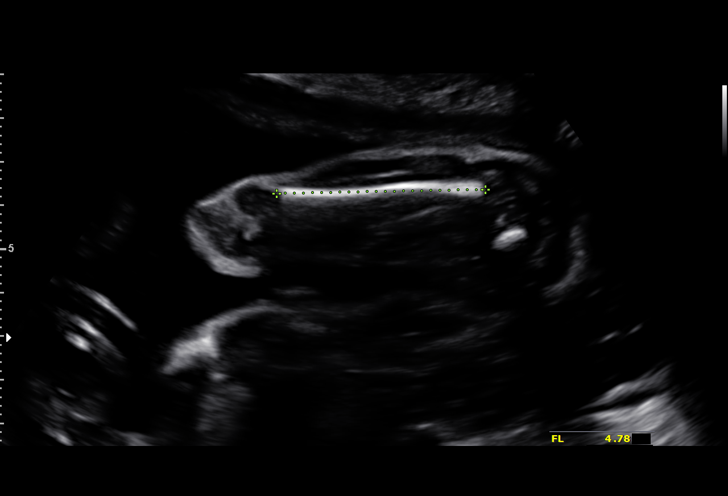
[im 22/85]
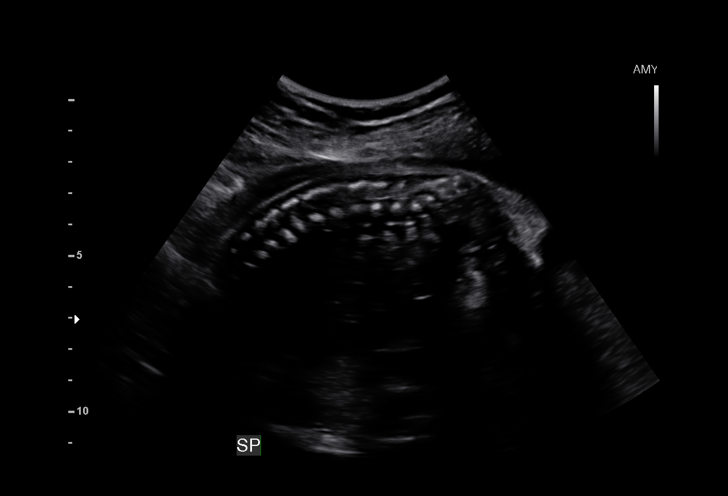
[im 29/85]
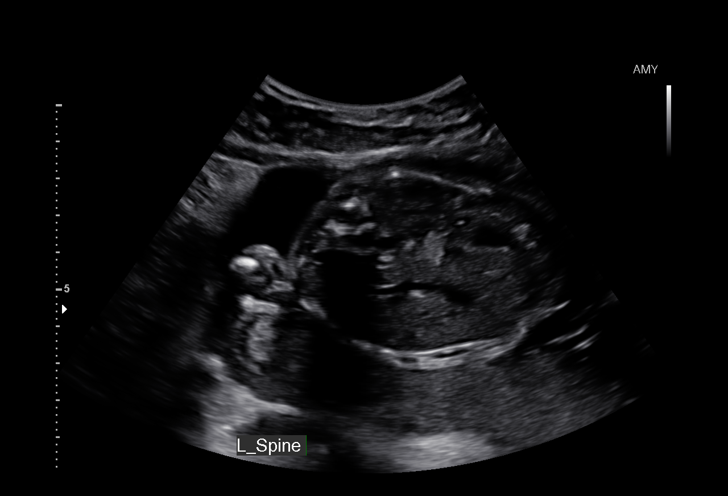
[im 35/85]
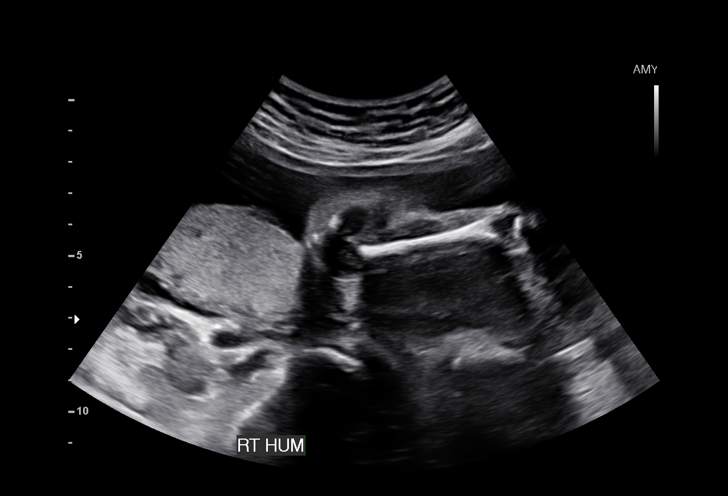
[im 41/85]
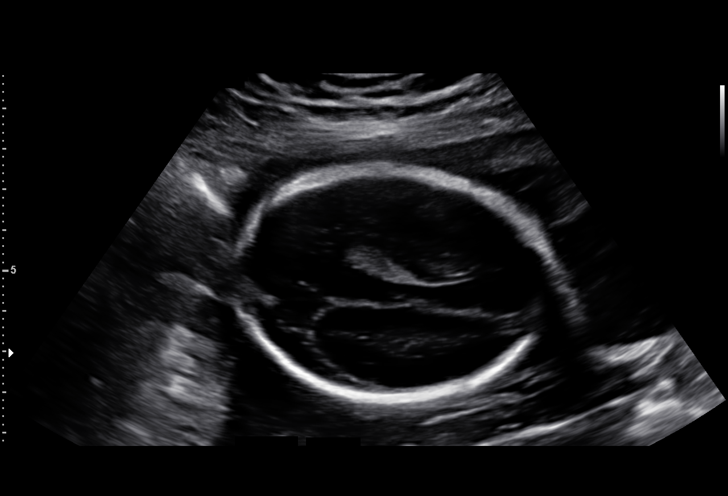
[im 47/85]
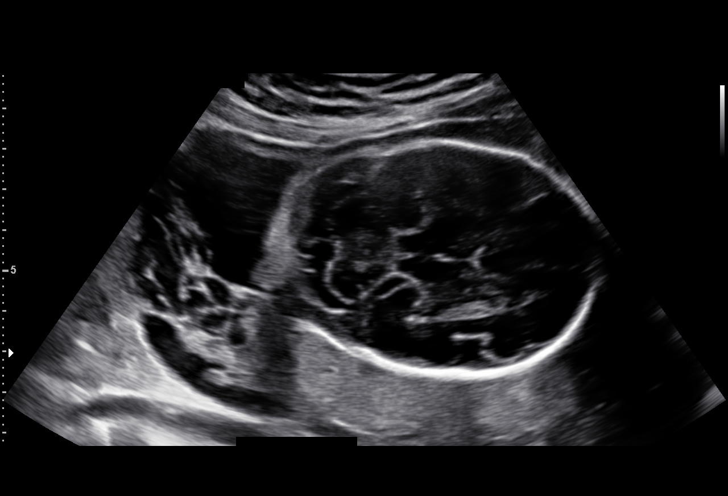
[im 53/85]
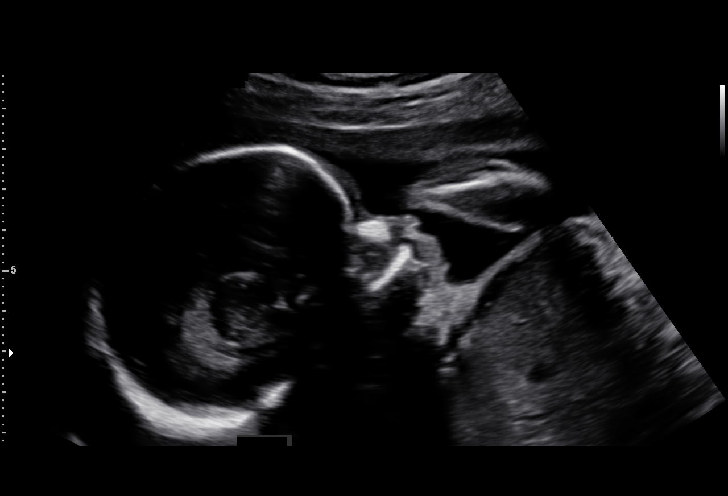
[im 60/85]
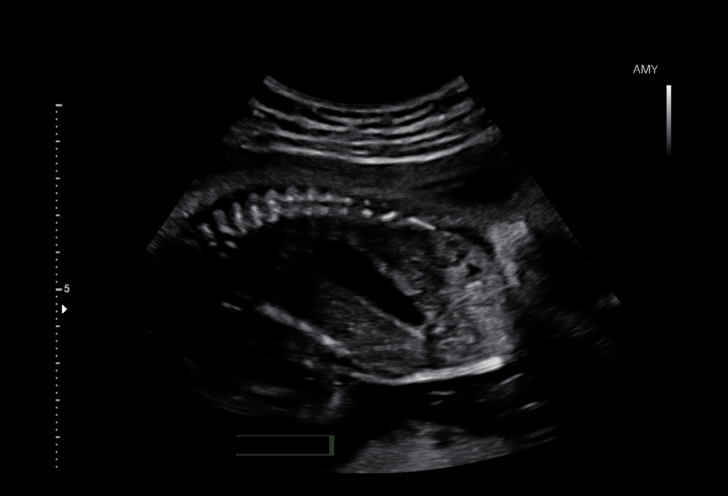
[im 66/85]
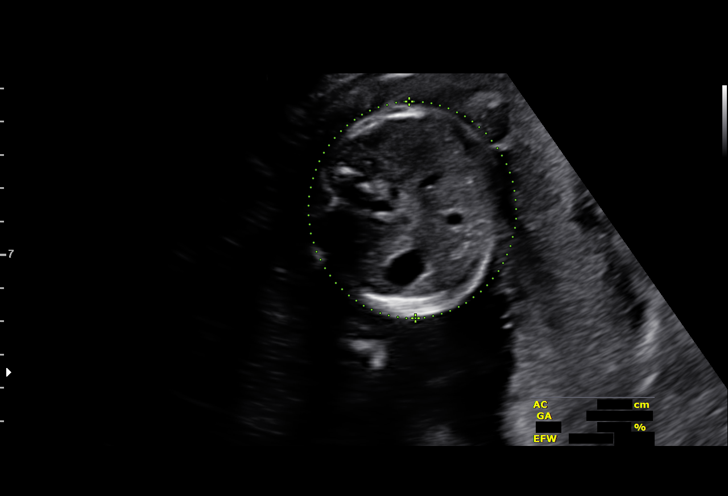
[im 72/85]
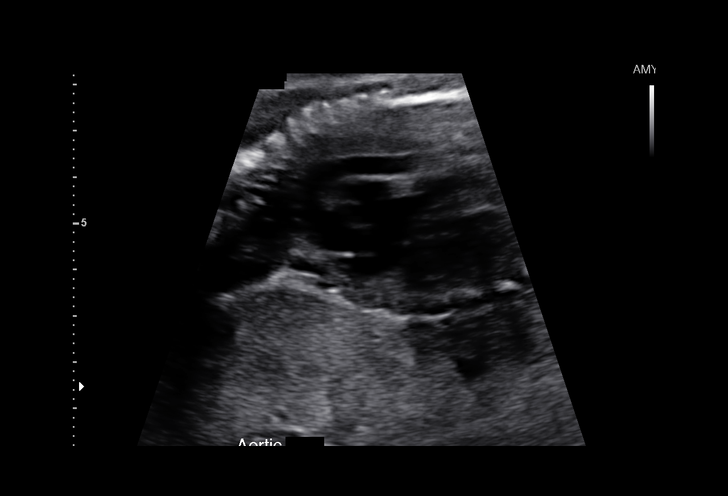
[im 78/85]
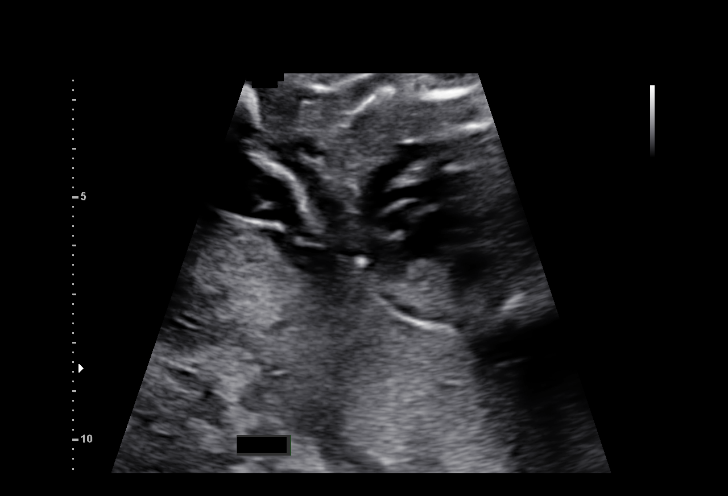
[im 85/85]
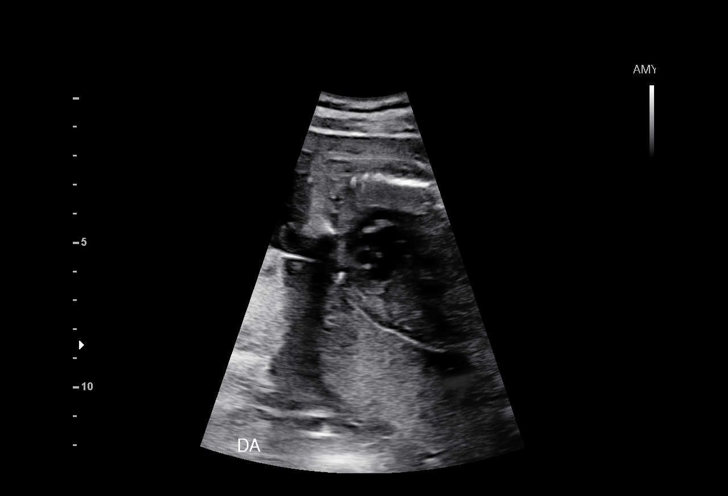

[14 of 28 positions shown; findings below may reference images not displayed]

FINDINGS: 1. Single intrauterine pregnancy.
2. Estimated fetal weight is in the 48th%.
3. Posterior placenta without evidence of previa.
4. Normal amniotic fluid volume.
5. Normal transabdominal cervical length.
6. The anatomy survey is limited as above; no
abnormalities seen.

---------------------------------------------------------------------- Recommendations

1. Appropriate fetal growth.
2. Limited anatomy survey:
- recommend follow up ultrasound in 3 weeks to complete
anatomic survey

## 2018-01-23 ENCOUNTER — Encounter (HOSPITAL_COMMUNITY): Payer: Self-pay
